# Patient Record
Sex: Male | Born: 1985
Health system: Southern US, Community
[De-identification: ages and names within clinical notes are randomized; demographics above are authoritative.]

## PROBLEM LIST (undated history)

## (undated) DIAGNOSIS — Z720 Tobacco use: Secondary | ICD-10-CM

---

## 2006-08-01 ENCOUNTER — Emergency Department: Payer: Self-pay | Admitting: Emergency Medicine

## 2006-09-21 ENCOUNTER — Ambulatory Visit: Payer: Self-pay | Admitting: Emergency Medicine

## 2008-02-02 ENCOUNTER — Emergency Department: Payer: Self-pay | Admitting: Emergency Medicine

## 2008-03-13 ENCOUNTER — Ambulatory Visit: Payer: Self-pay | Admitting: Psychiatry

## 2008-03-13 ENCOUNTER — Inpatient Hospital Stay (HOSPITAL_COMMUNITY): Admission: RE | Admit: 2008-03-13 | Discharge: 2008-03-19 | Payer: Self-pay | Admitting: Psychiatry

## 2008-03-13 ENCOUNTER — Emergency Department (HOSPITAL_COMMUNITY): Admission: EM | Admit: 2008-03-13 | Discharge: 2008-03-13 | Payer: Self-pay | Admitting: Emergency Medicine
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
## 2010-08-16 NOTE — Discharge Summary (Signed)
NAMEARJEN, DERINGER NO.:  [id number]   MEDICAL RECORD NO.:  [id number]          PATIENT TYPE:  IPS   LOCATION:  0405                          FACILITY:  [facility]   PHYSICIAN:  Anselm Jungling, MD  DATE OF BIRTH:  01-27-1986   DATE OF ADMISSION:  03/13/2008  DATE OF DISCHARGE:  03/19/2008                               DISCHARGE SUMMARY   IDENTIFYING DATA AND REASON FOR ADMISSION:  This was an inpatient  psychiatric admission for Johnathan Strickland, a 25 year old single [place] American  male who lives with his family outside of [place].  He was admitted  due to recent onset of paranoid psychosis.  Please refer to the  admission note for further details pertaining to the symptoms,  circumstances and history that led to his hospitalization.  He was given  an initial Axis I diagnosis of psychosis NOS.   MEDICAL AND LABORATORY:  The patient was in good health without any  active or chronic medical problems.  He was medically cleared in the  emergency department prior to admission, and then followed by the  psychiatric nurse practitioner during his stay.  There were no  significant medical issues.   HOSPITAL COURSE:  The patient was admitted to the adult inpatient  psychiatric service.  He presented as a well-nourished, normally-  developed adult male who was initially disoriented, confused, and  appeared to be responding to internal stimuli.  He was extremely  guarded, and made unusual grimacing expressions.  He only was able to  get brief, disorganized responses.   He was started on a trial of Zyprexa, which was increased stepwise to an  ultimate dose of 20 mg daily.  His family was contacted.  His mother  reported that he had been employed for 2 years at [employer], but  following a breakup with his girlfriend, who also worked in [employer], he began to have paranoid ideation that employees were  talking about him, and because of this, he refused to go back to  work  there.  He had no previous mental health history.  It was not  necessarily felt that he was abusing drugs or alcohol.   The patient began to respond to medication towards the end of his  inpatient stay.  At that time he became more spontaneous, appropriate,  and showed better insight into his situation.  On the final hospital day  there was a family session involving the patient and his mother.  He  reported that he was feeling well, had more energy, and felt more  outgoing.  He was able to discuss his depression and sadness over the  loss of his girlfriend.   The patient appeared to display some excessively religious ideation at  that time, but he appeared to be appropriate for discharge, and his  mother was comfortable taking him home.  There did not appear to be any  safety concerns.  The patient agree __________.   AFTERCARE:  The patient was to follow up at [facility] in  [place], [place], appointment time to be arranged at the time  of this  dictation.   DISCHARGE MEDICATIONS:  Zyprexa/Zydis 20 mg q.h.s.   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  AXIS II:  Deferred.  AXIS III:  No acute or chronic illnesses.  AXIS IV:  Stressors, severe.  AXIS V:  Global assessment of functioning on discharge 55.      Anselm Jungling, MD  Electronically Signed     SPB/MEDQ  D:  03/22/2008  T:  03/22/2008  Job:  [id number]

## 2011-01-03 LAB — CBC
HCT: 46.1 % (ref 39.0–52.0)
Hemoglobin: 15.7 g/dL (ref 13.0–17.0)
MCV: 90.8 fL (ref 78.0–100.0)
Platelets: 185 10*3/uL (ref 150–400)
RDW: 12 % (ref 11.5–15.5)
WBC: 12.5 10*3/uL — ABNORMAL HIGH (ref 4.0–10.5)

## 2011-01-03 LAB — HEPATIC FUNCTION PANEL
Bilirubin, Direct: 0.2 mg/dL (ref 0.0–0.3)
Indirect Bilirubin: 1 mg/dL — ABNORMAL HIGH (ref 0.3–0.9)
Total Bilirubin: 1.2 mg/dL (ref 0.3–1.2)

## 2011-01-03 LAB — BASIC METABOLIC PANEL
BUN: 12 mg/dL (ref 6–23)
Chloride: 101 mEq/L (ref 96–112)
Glucose, Bld: 89 mg/dL (ref 70–99)
Potassium: 3.9 mEq/L (ref 3.5–5.1)

## 2011-01-03 LAB — DIFFERENTIAL
Eosinophils Absolute: 0.1 10*3/uL (ref 0.0–0.7)
Eosinophils Relative: 0 % (ref 0–5)
Lymphs Abs: 2.1 10*3/uL (ref 0.7–4.0)
Monocytes Absolute: 1.1 10*3/uL — ABNORMAL HIGH (ref 0.1–1.0)

## 2011-01-03 LAB — URINALYSIS, ROUTINE W REFLEX MICROSCOPIC
Glucose, UA: NEGATIVE mg/dL
Ketones, ur: 40 mg/dL — AB
Nitrite: NEGATIVE
Protein, ur: NEGATIVE mg/dL
Urobilinogen, UA: 0.2 mg/dL (ref 0.0–1.0)

## 2011-01-03 LAB — RPR: RPR Ser Ql: NONREACTIVE

## 2011-01-03 LAB — RAPID URINE DRUG SCREEN, HOSP PERFORMED
Benzodiazepines: NOT DETECTED
Tetrahydrocannabinol: NOT DETECTED

## 2011-01-03 LAB — T4, FREE: Free T4: 1.13 ng/dL (ref 0.89–1.80)

## 2011-01-03 LAB — TRICYCLICS SCREEN, URINE: TCA Scrn: NOT DETECTED

## 2011-01-03 LAB — TSH: TSH: 0.645 u[IU]/mL (ref 0.350–4.500)

## 2011-01-03 LAB — GC/CHLAMYDIA PROBE AMP, URINE: GC Probe Amp, Urine: NEGATIVE

## 2016-11-15 ENCOUNTER — Encounter (HOSPITAL_COMMUNITY): Payer: Self-pay | Admitting: *Deleted

## 2016-11-15 ENCOUNTER — Emergency Department (HOSPITAL_COMMUNITY)
Admission: EM | Admit: 2016-11-15 | Discharge: 2016-11-15 | Disposition: A | Discharge status: Home / Self Care | Payer: Self-pay | Attending: Emergency Medicine | Admitting: Emergency Medicine

## 2016-11-15 DIAGNOSIS — F17210 Nicotine dependence, cigarettes, uncomplicated: Secondary | ICD-10-CM | POA: Insufficient documentation

## 2016-11-15 DIAGNOSIS — L02412 Cutaneous abscess of left axilla: Secondary | ICD-10-CM | POA: Insufficient documentation

## 2016-11-15 MED ORDER — HYDROCODONE-ACETAMINOPHEN 5-325 MG PO TABS
1.0000 | ORAL_TABLET | Freq: Once | ORAL | Status: AC
  Administered 2016-11-15: 1 via ORAL
  Filled 2016-11-15: qty 1

## 2016-11-15 MED ORDER — POVIDONE-IODINE 10 % EX SOLN
CUTANEOUS | Status: AC
  Filled 2016-11-15: qty 15

## 2016-11-15 MED ORDER — HYDROCODONE-ACETAMINOPHEN 5-325 MG PO TABS: ORAL_TABLET | ORAL | 0 refills | Status: DC

## 2016-11-15 MED ORDER — SULFAMETHOXAZOLE-TRIMETHOPRIM 800-160 MG PO TABS
1.0000 | ORAL_TABLET | Freq: Once | ORAL | Status: AC
  Administered 2016-11-15: 1 via ORAL
  Filled 2016-11-15: qty 1

## 2016-11-15 MED ORDER — SULFAMETHOXAZOLE-TRIMETHOPRIM 800-160 MG PO TABS: 1.0000 | ORAL_TABLET | Freq: Two times a day (BID) | ORAL | 0 refills | Status: AC

## 2016-11-15 MED ORDER — LIDOCAINE-EPINEPHRINE (PF) 2 %-1:200000 IJ SOLN
10.0000 mL | Freq: Once | INTRAMUSCULAR | Status: AC
  Administered 2016-11-15: 10 mL via INTRADERMAL
  Filled 2016-11-15: qty 20

## 2016-11-15 MED ORDER — IBUPROFEN 800 MG PO TABS
800.0000 mg | ORAL_TABLET | Freq: Three times a day (TID) | ORAL | 0 refills | Status: DC
Start: 2016-11-15 — End: 2017-08-11

## 2016-11-15 NOTE — ED Notes (Addendum)
ED Provider at bedside for I&D. Suture set up at bedside.

## 2016-11-15 NOTE — ED Provider Notes (Signed)
[facility]-EMER[facility]Y DEPT Provider Note   CSN: [id number] Arrival date & time: 11/15/16  1814     History   Chief Complaint Chief Complaint  Patient presents with  . Abscess    HPI Johnathan Lauritsen. is a 31 y.o. male.  HPI  Johnathan Albares. is a 31 y.o. male who presents to the Emergency Department complaining of pain, swelling to the left axilla. Symptoms present for one week.  Has hx of previous "boils" to his underarm.  He states the area began to drain several hours prior to arrival.  Pain to his left underarm associated with movement of the arm.  He denies fever, chills, chest pain and redness. No new deodorant.  Nothing makes the symptoms better.     History reviewed. No pertinent past medical history.  There are no active problems to display for this patient.   History reviewed. No pertinent surgical history.   Home Medications    Prior to Admission medications   Not on File    Family History No family history on file.  Social History Social History  Substance Use Topics  . Smoking status: Current Every Day Smoker    Packs/day: 1.00    Types: Cigarettes  . Smokeless tobacco: Never Used  . Alcohol use Yes     Comment: 1-3 beers daily      Allergies   Patient has no known allergies.   Review of Systems Review of Systems  Constitutional: Negative for chills and fever.  Gastrointestinal: Negative for nausea and vomiting.  Musculoskeletal: Negative for arthralgias and joint swelling.  Skin: Negative for color change.       Pain, swelling and of the left axilla  Hematological: Negative for adenopathy.  All other systems reviewed and are negative.    Physical Exam Updated Vital Signs BP 136/75   Pulse 76   Temp 98.4 F (36.9 C) (Oral)   Resp 15   Ht 6' (1.829 m)   Wt 102.1 kg (225 lb)   SpO2 98%   BMI 30.52 kg/m   Physical Exam  Constitutional: He is oriented to person, place, and time. He appears well-developed and  well-nourished. No distress.  HENT:  Head: Normocephalic and atraumatic.  Neck: Normal range of motion. Neck supple.  Cardiovascular: Normal rate, regular rhythm and intact distal pulses.   Pulmonary/Chest: Effort normal and breath sounds normal. No respiratory distress. He exhibits no tenderness.  Musculoskeletal: Normal range of motion.  Neurological: He is alert and oriented to person, place, and time. No sensory deficit. He exhibits normal muscle tone. Coordination normal.  Skin: Skin is warm and dry. Capillary refill takes less than 2 seconds. There is erythema.  Large, focal area of fluctuance and induration of left axilla.  Mild surrounding erythema. No drainage.   Nursing note and vitals reviewed.    ED Treatments / Results  Labs (all labs ordered are listed, but only abnormal results are displayed) Labs Reviewed - No data to display  EKG  EKG Interpretation None       Radiology No results found.  Procedures Procedures (including critical care time)  INCISION AND DRAINAGE Performed by: Maxwell Caul. Consent: Verbal consent obtained. Risks and benefits: risks, benefits and alternatives were discussed Type: abscess  Body area: left axilla  Anesthesia: local infiltration  Incision was made with a # 11scalpel.  Local anesthetic: lidocaine 2 % w/ epinephrine  Anesthetic total: 3 ml  Complexity: complex Blunt dissection to break up loculations  Drainage:  purulent  Drainage amount: small  Packing material: 1/4 in iodoform gauze  Patient tolerance: Patient tolerated the procedure well with no immediate complications.   Medications Ordered in ED Medications  povidone-iodine (BETADINE) 10 % external solution (not administered)  sulfamethoxazole-trimethoprim (BACTRIM DS,SEPTRA DS) 800-160 MG per tablet 1 tablet (not administered)  HYDROcodone-acetaminophen (NORCO/VICODIN) 5-325 MG per tablet 1 tablet (not administered)  lidocaine-EPINEPHrine  (XYLOCAINE W/EPI) 2 %-1:200000 (PF) injection 10 mL (10 mLs Intradermal Given by Other 11/15/16 1944)     Initial Impression / Assessment and Plan / ED Course  I have reviewed the triage vital signs and the nursing notes.  Pertinent labs & imaging results that were available during my care of the patient were reviewed by me and considered in my medical decision making (see chart for details).     Pt well appearing,  Non-toxic.  Abscess to left axilla w/o lymphangitis. Hx of same.  Pt agrees to packing removal in 2 days, warm wet compresses and ER return if not improving  Final Clinical Impressions(s) / ED Diagnoses   Final diagnoses:  Abscess of left axilla    New Prescriptions New Prescriptions   No medications on file     Rosey Bath 11/16/16 2347    Eber Hong, MD 11/17/16 2340

## 2016-11-15 NOTE — Discharge Instructions (Signed)
Warm wet compresses or soaks to the left underarm area 2-3 times a day.  Keep the area bandaged.  The packing will need to be removed in 2 days.  Return here for any worsening symptoms

## 2016-11-15 NOTE — ED Triage Notes (Signed)
Pt c/o "boil" to left axilla x 1 week. Johnathan Strickland pussy drainage per family. Denies fever.

## 2017-08-11 ENCOUNTER — Encounter (HOSPITAL_COMMUNITY): Payer: Self-pay | Admitting: Emergency Medicine

## 2017-08-11 ENCOUNTER — Inpatient Hospital Stay (HOSPITAL_COMMUNITY)
Admission: EM | Admit: 2017-08-11 | Discharge: 2017-08-14 | DRG: 638 | Disposition: A | Discharge status: Home / Self Care | Payer: Self-pay | Attending: Internal Medicine | Admitting: Internal Medicine

## 2017-08-11 DIAGNOSIS — Z720 Tobacco use: Secondary | ICD-10-CM

## 2017-08-11 DIAGNOSIS — Z833 Family history of diabetes mellitus: Secondary | ICD-10-CM

## 2017-08-11 DIAGNOSIS — E785 Hyperlipidemia, unspecified: Secondary | ICD-10-CM | POA: Diagnosis present

## 2017-08-11 DIAGNOSIS — N179 Acute kidney failure, unspecified: Secondary | ICD-10-CM

## 2017-08-11 DIAGNOSIS — D72829 Elevated white blood cell count, unspecified: Secondary | ICD-10-CM | POA: Diagnosis present

## 2017-08-11 DIAGNOSIS — F17210 Nicotine dependence, cigarettes, uncomplicated: Secondary | ICD-10-CM | POA: Diagnosis present

## 2017-08-11 DIAGNOSIS — E1110 Type 2 diabetes mellitus with ketoacidosis without coma: Principal | ICD-10-CM | POA: Diagnosis present

## 2017-08-11 DIAGNOSIS — R739 Hyperglycemia, unspecified: Secondary | ICD-10-CM

## 2017-08-11 DIAGNOSIS — E876 Hypokalemia: Secondary | ICD-10-CM | POA: Diagnosis present

## 2017-08-11 DIAGNOSIS — E1010 Type 1 diabetes mellitus with ketoacidosis without coma: Secondary | ICD-10-CM

## 2017-08-11 DIAGNOSIS — E1310 Other specified diabetes mellitus with ketoacidosis without coma: Secondary | ICD-10-CM

## 2017-08-11 DIAGNOSIS — E119 Type 2 diabetes mellitus without complications: Secondary | ICD-10-CM

## 2017-08-11 DIAGNOSIS — Z888 Allergy status to other drugs, medicaments and biological substances status: Secondary | ICD-10-CM

## 2017-08-11 DIAGNOSIS — E860 Dehydration: Secondary | ICD-10-CM | POA: Diagnosis present

## 2017-08-11 HISTORY — DX: Tobacco use: Z72.0

## 2017-08-11 LAB — BASIC METABOLIC PANEL
Anion gap: 23 — ABNORMAL HIGH (ref 5–15)
BUN: 18 mg/dL (ref 6–20)
CHLORIDE: 91 mmol/L — AB (ref 101–111)
CO2: 19 mmol/L — AB (ref 22–32)
Calcium: 10.3 mg/dL (ref 8.9–10.3)
Creatinine, Ser: 1.83 mg/dL — ABNORMAL HIGH (ref 0.61–1.24)
GFR calc Af Amer: 55 mL/min — ABNORMAL LOW (ref >60)
GFR calc non Af Amer: 47 mL/min — ABNORMAL LOW (ref >60)
GLUCOSE: 725 mg/dL — AB (ref 65–99)
POTASSIUM: 4.4 mmol/L (ref 3.5–5.1)
Sodium: 133 mmol/L — ABNORMAL LOW (ref 135–145)

## 2017-08-11 LAB — CBG MONITORING, ED
GLUCOSE-CAPILLARY: 308 mg/dL — AB (ref 65–99)
Glucose-Capillary: >600 mg/dL (ref 65–99)
Glucose-Capillary: 461 mg/dL — ABNORMAL HIGH (ref 65–99)
Glucose-Capillary: 393 mg/dL — ABNORMAL HIGH (ref 65–99)

## 2017-08-11 LAB — CBC
HEMATOCRIT: 47.8 % (ref 39.0–52.0)
Hemoglobin: 16.9 g/dL (ref 13.0–17.0)
MCH: 28.9 pg (ref 26.0–34.0)
MCHC: 35.4 g/dL (ref 30.0–36.0)
MCV: 81.7 fL (ref 78.0–100.0)
Platelets: 273 10*3/uL (ref 150–400)
RBC: 5.85 MIL/uL — ABNORMAL HIGH (ref 4.22–5.81)
RDW: 12.8 % (ref 11.5–15.5)
WBC: 11.2 10*3/uL — ABNORMAL HIGH (ref 4.0–10.5)

## 2017-08-11 LAB — I-STAT VENOUS BLOOD GAS, ED
Acid-base deficit: 3 mmol/L — ABNORMAL HIGH (ref 0.0–2.0)
Bicarbonate: 22.7 mmol/L (ref 20.0–28.0)
O2 SAT: 43 %
PCO2 VEN: 43.7 mmHg — AB (ref 44.0–60.0)
PO2 VEN: 26 mmHg — AB (ref 32.0–45.0)
TCO2: 24 mmol/L (ref 22–32)
pH, Ven: 7.324 (ref 7.250–7.430)

## 2017-08-11 LAB — URINALYSIS, ROUTINE W REFLEX MICROSCOPIC
BACTERIA UA: NONE SEEN
BILIRUBIN URINE: NEGATIVE
HGB URINE DIPSTICK: NEGATIVE
Ketones, ur: 20 mg/dL — AB
LEUKOCYTES UA: NEGATIVE
NITRITE: NEGATIVE
Protein, ur: NEGATIVE mg/dL
SPECIFIC GRAVITY, URINE: 1.023 (ref 1.005–1.030)
pH: 5 (ref 5.0–8.0)

## 2017-08-11 LAB — BETA-HYDROXYBUTYRIC ACID: Beta-Hydroxybutyric Acid: 6.42 mmol/L — ABNORMAL HIGH (ref 0.05–0.27)

## 2017-08-11 MED ORDER — NICOTINE 21 MG/24HR TD PT24
21.0000 mg | MEDICATED_PATCH | Freq: Every day | TRANSDERMAL | Status: DC
  Filled 2017-08-11 (×2): qty 1

## 2017-08-11 MED ORDER — SODIUM CHLORIDE 0.9 % IV BOLUS
2000.0000 mL | Freq: Once | INTRAVENOUS | Status: AC
  Administered 2017-08-11: 2000 mL via INTRAVENOUS

## 2017-08-11 MED ORDER — ZOLPIDEM TARTRATE 5 MG PO TABS: 5.0000 mg | ORAL_TABLET | Freq: Every evening | ORAL | Status: DC | PRN

## 2017-08-11 MED ORDER — SODIUM CHLORIDE 0.9 % IV SOLN
INTRAVENOUS | Status: DC
  Administered 2017-08-11 – 2017-08-12 (×2): via INTRAVENOUS

## 2017-08-11 MED ORDER — ONDANSETRON HCL 4 MG/2ML IJ SOLN
4.0000 mg | Freq: Once | INTRAMUSCULAR | Status: AC
  Administered 2017-08-11: 4 mg via INTRAVENOUS

## 2017-08-11 MED ORDER — DEXTROSE-NACL 5-0.45 % IV SOLN: INTRAVENOUS | Status: DC

## 2017-08-11 MED ORDER — POTASSIUM CHLORIDE 10 MEQ/100ML IV SOLN
10.0000 meq | INTRAVENOUS | Status: AC
  Administered 2017-08-11 – 2017-08-12 (×2): 10 meq via INTRAVENOUS
  Filled 2017-08-11 (×2): qty 100

## 2017-08-11 MED ORDER — DEXTROSE-NACL 5-0.45 % IV SOLN
INTRAVENOUS | Status: DC
Start: 2017-08-11 — End: 2017-08-12
  Administered 2017-08-12: 02:00:00 via INTRAVENOUS

## 2017-08-11 MED ORDER — ENOXAPARIN SODIUM 40 MG/0.4ML ~~LOC~~ SOLN
40.0000 mg | Freq: Every day | SUBCUTANEOUS | Status: DC
  Administered 2017-08-12 – 2017-08-13 (×2): 40 mg via SUBCUTANEOUS
  Filled 2017-08-11 (×2): qty 0.4

## 2017-08-11 MED ORDER — SODIUM CHLORIDE 0.9 % IV SOLN
INTRAVENOUS | Status: DC
  Administered 2017-08-11: 5.4 [IU]/h via INTRAVENOUS
  Filled 2017-08-11: qty 1

## 2017-08-11 MED ORDER — SODIUM CHLORIDE 0.9 % IV SOLN
INTRAVENOUS | Status: DC
  Filled 2017-08-11: qty 1

## 2017-08-11 MED ORDER — SODIUM CHLORIDE 0.9 % IV BOLUS
1000.0000 mL | Freq: Once | INTRAVENOUS | Status: AC
  Administered 2017-08-11: 1000 mL via INTRAVENOUS

## 2017-08-11 MED ORDER — ACETAMINOPHEN 325 MG PO TABS: 650.0000 mg | ORAL_TABLET | Freq: Four times a day (QID) | ORAL | Status: DC | PRN

## 2017-08-11 MED ORDER — ONDANSETRON HCL 4 MG/2ML IJ SOLN
INTRAMUSCULAR | Status: AC
  Filled 2017-08-11: qty 4

## 2017-08-11 MED ORDER — ONDANSETRON HCL 4 MG/2ML IJ SOLN: 4.0000 mg | Freq: Three times a day (TID) | INTRAMUSCULAR | Status: DC | PRN

## 2017-08-11 MED ORDER — SODIUM CHLORIDE 0.9 % IV SOLN
8.0000 mg | Freq: Once | INTRAVENOUS | Status: DC
  Filled 2017-08-11: qty 4

## 2017-08-11 MED ORDER — SODIUM CHLORIDE 0.9 % IV SOLN
Freq: Once | INTRAVENOUS | Status: AC
  Administered 2017-08-11: 21:00:00 via INTRAVENOUS

## 2017-08-11 NOTE — ED Notes (Addendum)
RN notified of CBG value collected at 20:26.

## 2017-08-11 NOTE — H&P (Addendum)
History and Physical    Johnathan Strickland. DGU:[id number] DOB: 04-Feb-1986 DOA: 08/11/2017  Referring MD/NP/PA:   PCP: Patient, No Pcp Per   Patient coming from:  The patient is coming from home.  At baseline, pt is independent for most of ADL.   Chief Complaint: Generalized weakness, lightheadedness, polyuria, polydipsia  HPI: Johnathan Strickland. is a 32 y.o. male with medical history significant of tobacco abuse, who presents with generalized weakness, lightheadedness, polyuria, polydipsia.  Patient states that he has been having feeling bad in the past 4 days, which has been progressively getting worse.  He has generalized weakness, lightheadedness, polyuria and polydipsia.  He has mild cough, but no chest pain or shortness of breath.  Denies fever or chills.  Patient has nausea, no vomiting, diarrhea or abdominal pain.  Patient denies dysuria or burning on urination. No unilateral weakness or numbness in extremities.  No history of diabetes.  Patient  ED Course: pt was found to have new onset of diabetes with DKA, bicarbonate 19, blood sugar 725, anion gap 23, WBC 11.2, negative urinalysis, temperature 99.5, tachycardia, tachypnea, oxygen saturation 97% on room air.  Patient is admitted to stepdown as inpatient.  Review of Systems:   General: no fevers, chills, no body weight gain, has poor appetite, has fatigue HEENT: no blurry vision, hearing changes or sore throat Respiratory: no dyspnea, has coughing, no wheezing CV: no chest pain, no palpitations GI: no nausea, vomiting, abdominal pain, diarrhea, constipation GU: no dysuria, burning on urination, has increased urinary frequency, no  hematuria  Ext: no leg edema Neuro: no unilateral weakness, numbness, or tingling, no vision change or hearing loss Skin: no rash, no skin tear. MSK: No muscle spasm, no deformity, no limitation of range of movement in spin Heme: No easy bruising.  Travel history: No recent long distant  travel.  Allergy: No Known Allergies  Past Medical History:  Diagnosis Date  . Tobacco abuse     History reviewed. No pertinent surgical history.  Social History:  reports that he has been smoking cigarettes.  He has been smoking about 1.00 pack per day. He has never used smokeless tobacco. He reports that he drinks alcohol. He reports that he does not use drugs.  Family History:  Family History  Problem Relation Age of Onset  . Diabetes Mellitus II Father      Prior to Admission medications   Not on File    Physical Exam: Vitals:   08/11/17 2146 08/11/17 2200 08/11/17 2230 08/11/17 2300  BP: (!) 148/111 (!) 150/112 (!) 139/108 (!) 137/104  Pulse: 94 92 80 85  Resp:      Temp:      TempSrc:      SpO2: 96% 96% 98% 100%   General: Not in acute distress.  Dry mucous membrane HEENT:       Eyes: PERRL, EOMI, no scleral icterus.       ENT: No discharge from the ears and nose, no pharynx injection, no tonsillar enlargement.        Neck: No JVD, no bruit, no mass felt. Heme: No neck lymph node enlargement. Cardiac: S1/S2, RRR, No murmurs, No gallops or rubs. Respiratory: no rales, wheezing, rhonchi or rubs. GI: Soft, nondistended, nontender, no rebound pain, no organomegaly, BS present. GU: No hematuria Ext: No pitting leg edema bilaterally. 2+DP/PT pulse bilaterally. Musculoskeletal: No joint deformities, No joint redness or warmth, no limitation of ROM in spin. Skin: No rashes.  Neuro: Alert, oriented  X3, cranial nerves II-XII grossly intact, moves all extremities normally.  Psych: Patient is not psychotic, no suicidal or hemocidal ideation.  Labs on Admission: I have personally reviewed following labs and imaging studies  CBC: Recent Labs  Lab 08/11/17 1842  WBC 11.2*  HGB 16.9  HCT 47.8  MCV 81.7  PLT 273   Basic Metabolic Panel: Recent Labs  Lab 08/11/17 1842  NA 133*  K 4.4  CL 91*  CO2 19*  GLUCOSE 725*  BUN 18  CREATININE 1.83*  CALCIUM 10.3    GFR: CrCl cannot be calculated (Unknown ideal weight.). Liver Function Tests: No results for input(s): AST, ALT, ALKPHOS, BILITOT, PROT, ALBUMIN in the last 168 hours. No results for input(s): LIPASE, AMYLASE in the last 168 hours. No results for input(s): AMMONIA in the last 168 hours. Coagulation Profile: No results for input(s): INR, PROTIME in the last 168 hours. Cardiac Enzymes: No results for input(s): CKTOTAL, CKMB, CKMBINDEX, TROPONINI in the last 168 hours. BNP (last 3 results) No results for input(s): PROBNP in the last 8760 hours. HbA1C: No results for input(s): HGBA1C in the last 72 hours. CBG: Recent Labs  Lab 08/11/17 1829 08/11/17 2025 08/11/17 2136 08/11/17 2249 08/11/17 2356  GLUCAP >600* >600* 461* 393* 308*   Lipid Profile: No results for input(s): CHOL, HDL, LDLCALC, TRIG, CHOLHDL, LDLDIRECT in the last 72 hours. Thyroid Function Tests: No results for input(s): TSH, T4TOTAL, FREET4, T3FREE, THYROIDAB in the last 72 hours. Anemia Panel: No results for input(s): VITAMINB12, FOLATE, FERRITIN, TIBC, IRON, RETICCTPCT in the last 72 hours. Urine analysis:    Component Value Date/Time   COLORURINE STRAW (A) 08/11/2017 1830   APPEARANCEUR CLEAR 08/11/2017 1830   LABSPEC 1.023 08/11/2017 1830   PHURINE 5.0 08/11/2017 1830   GLUCOSEU >=500 (A) 08/11/2017 1830   HGBUR NEGATIVE 08/11/2017 1830   BILIRUBINUR NEGATIVE 08/11/2017 1830   KETONESUR 20 (A) 08/11/2017 1830   PROTEINUR NEGATIVE 08/11/2017 1830   UROBILINOGEN 0.2 03/13/2008 1936   NITRITE NEGATIVE 08/11/2017 1830   LEUKOCYTESUR NEGATIVE 08/11/2017 1830   Sepsis Labs: (procalcitonin:4,lacticidven:4) )No results found for this or any previous visit (from the past 240 hour(s)).   Radiological Exams on Admission: No results found.   EKG: Independently reviewed. Sinus rhythm, QTC 443, early R wave progression  Assessment/Plan Principal Problem:   DKA (diabetic ketoacidoses)  (HCC) Active Problems:   New onset type 2 diabetes mellitus (HCC)   AKI (acute kidney injury) (HCC)   Tobacco abuse   Leukocytosis   New onset type 2 diabetes mellitus and DKA (diabetic ketoacidoses) (HCC): bicarbonate 19, blood sugar 725, anion gap 23.  Mental status normal.  - will admit to SDU as inpt - IVF: 1L ringer and 2L NS bolus, followed by 125 cc/h - check A1c and FLP - start DKA protocol with BMP q4h - IVF: NS 125 cc/h; will switch to D5-1/2NS when CBG<250 - replete K as needed - Zofran prn nausea  - NPO  - consult to diabetic educator and case manager  Addendum: the BMP at 22:40 showed AG 12, closed -will start Lantus 5 U now -repeat BMP at 2 hours later, if still has normal AG-->will start pt with SSI and d/c insulin drip.   Leukocytosis: Patient has mild leukocytosis WBC 11.3, likely due to stress induced to demargination. UA negative. No fever. -will follow up blood -follow up by CBC   AKI: Likely due to prerenal secondary to dehydration and continuation - IVF as above - Follow  up renal function by BMP  Tobacco abuse: -Did counseling about importance of quitting smoking -Nicotine patch   DVT ppx:  SQ Lovenox Code Status: Full code Family Communication:  Yes, patient's girlfriend at bed side Disposition Plan:  Anticipate discharge back to previous home environment Consults called:  none Admission status:  SDU/inpation       Date of Service 08/12/2017    Lorretta Harp [facility] Pager [phone]  If 7PM-7AM, please contact night-coverage [email or web address] Password [facility] 08/12/2017, 12:31 AM

## 2017-08-11 NOTE — ED Notes (Signed)
Reports feeling light headed as if he is going to pass out.

## 2017-08-11 NOTE — ED Triage Notes (Signed)
Pt to ER sent from [facility] for hyperglycemia. No documented hx of diabetes. Pt reports 3-4 days of "not feeling well," reports frequent urination. CBG monitor at this time is reading "high."

## 2017-08-12 ENCOUNTER — Encounter (HOSPITAL_COMMUNITY): Payer: Self-pay | Admitting: Internal Medicine

## 2017-08-12 ENCOUNTER — Other Ambulatory Visit: Payer: Self-pay

## 2017-08-12 DIAGNOSIS — E1310 Other specified diabetes mellitus with ketoacidosis without coma: Secondary | ICD-10-CM

## 2017-08-12 DIAGNOSIS — E876 Hypokalemia: Secondary | ICD-10-CM

## 2017-08-12 DIAGNOSIS — E7849 Other hyperlipidemia: Secondary | ICD-10-CM

## 2017-08-12 DIAGNOSIS — D72829 Elevated white blood cell count, unspecified: Secondary | ICD-10-CM | POA: Diagnosis present

## 2017-08-12 LAB — GLUCOSE, CAPILLARY
GLUCOSE-CAPILLARY: 205 mg/dL — AB (ref 65–99)
GLUCOSE-CAPILLARY: 193 mg/dL — AB (ref 65–99)
GLUCOSE-CAPILLARY: 168 mg/dL — AB (ref 65–99)
GLUCOSE-CAPILLARY: 156 mg/dL — AB (ref 65–99)
GLUCOSE-CAPILLARY: 293 mg/dL — AB (ref 65–99)
GLUCOSE-CAPILLARY: 336 mg/dL — AB (ref 65–99)
Glucose-Capillary: 225 mg/dL — ABNORMAL HIGH (ref 65–99)
Glucose-Capillary: 284 mg/dL — ABNORMAL HIGH (ref 65–99)

## 2017-08-12 LAB — BASIC METABOLIC PANEL
ANION GAP: 11 (ref 5–15)
ANION GAP: 11 (ref 5–15)
ANION GAP: 11 (ref 5–15)
ANION GAP: 8 (ref 5–15)
Anion gap: 12 (ref 5–15)
BUN: 14 mg/dL (ref 6–20)
BUN: 12 mg/dL (ref 6–20)
BUN: 10 mg/dL (ref 6–20)
BUN: 11 mg/dL (ref 6–20)
BUN: 10 mg/dL (ref 6–20)
CALCIUM: 8.7 mg/dL — AB (ref 8.9–10.3)
CALCIUM: 8.6 mg/dL — AB (ref 8.9–10.3)
CHLORIDE: 104 mmol/L (ref 101–111)
CHLORIDE: 104 mmol/L (ref 101–111)
CHLORIDE: 104 mmol/L (ref 101–111)
CO2: 24 mmol/L (ref 22–32)
CO2: 24 mmol/L (ref 22–32)
CO2: 23 mmol/L (ref 22–32)
CO2: 22 mmol/L (ref 22–32)
CO2: 24 mmol/L (ref 22–32)
Calcium: 8.7 mg/dL — ABNORMAL LOW (ref 8.9–10.3)
Calcium: 8.3 mg/dL — ABNORMAL LOW (ref 8.9–10.3)
Calcium: 8.5 mg/dL — ABNORMAL LOW (ref 8.9–10.3)
Chloride: 103 mmol/L (ref 101–111)
Chloride: 104 mmol/L (ref 101–111)
Creatinine, Ser: 1.39 mg/dL — ABNORMAL HIGH (ref 0.61–1.24)
Creatinine, Ser: 1.49 mg/dL — ABNORMAL HIGH (ref 0.61–1.24)
Creatinine, Ser: 1.41 mg/dL — ABNORMAL HIGH (ref 0.61–1.24)
Creatinine, Ser: 1.28 mg/dL — ABNORMAL HIGH (ref 0.61–1.24)
Creatinine, Ser: 1.24 mg/dL (ref 0.61–1.24)
GFR calc Af Amer: >60 mL/min (ref >60)
GFR calc non Af Amer: >60 mL/min (ref >60)
GLUCOSE: 286 mg/dL — AB (ref 65–99)
GLUCOSE: 152 mg/dL — AB (ref 65–99)
Glucose, Bld: 223 mg/dL — ABNORMAL HIGH (ref 65–99)
Glucose, Bld: 322 mg/dL — ABNORMAL HIGH (ref 65–99)
Glucose, Bld: 270 mg/dL — ABNORMAL HIGH (ref 65–99)
POTASSIUM: 3.5 mmol/L (ref 3.5–5.1)
POTASSIUM: 3.1 mmol/L — AB (ref 3.5–5.1)
POTASSIUM: 3.7 mmol/L (ref 3.5–5.1)
POTASSIUM: 3.9 mmol/L (ref 3.5–5.1)
Potassium: 3.8 mmol/L (ref 3.5–5.1)
SODIUM: 139 mmol/L (ref 135–145)
SODIUM: 138 mmol/L (ref 135–145)
SODIUM: 137 mmol/L (ref 135–145)
SODIUM: 136 mmol/L (ref 135–145)
Sodium: 139 mmol/L (ref 135–145)

## 2017-08-12 LAB — LIPID PANEL
CHOL/HDL RATIO: 6.6 ratio
CHOLESTEROL: 192 mg/dL (ref 0–200)
HDL: 29 mg/dL — AB (ref >40)
LDL Cholesterol: 118 mg/dL — ABNORMAL HIGH (ref 0–99)
TRIGLYCERIDES: 226 mg/dL — AB (ref <150)
VLDL: 45 mg/dL — ABNORMAL HIGH (ref 0–40)

## 2017-08-12 LAB — CBG MONITORING, ED: GLUCOSE-CAPILLARY: 241 mg/dL — AB (ref 65–99)

## 2017-08-12 LAB — SODIUM, URINE, RANDOM: Sodium, Ur: <10 mmol/L

## 2017-08-12 LAB — CREATININE, URINE, RANDOM: Creatinine, Urine: 42.86 mg/dL

## 2017-08-12 LAB — HEMOGLOBIN A1C
Hgb A1c MFr Bld: 11.6 % — ABNORMAL HIGH (ref 4.8–5.6)
Mean Plasma Glucose: 286.22 mg/dL

## 2017-08-12 LAB — HIV ANTIBODY (ROUTINE TESTING W REFLEX): HIV Screen 4th Generation wRfx: NONREACTIVE

## 2017-08-12 LAB — MRSA PCR SCREENING: MRSA BY PCR: NEGATIVE

## 2017-08-12 MED ORDER — INSULIN ASPART 100 UNIT/ML ~~LOC~~ SOLN
0.0000 [IU] | SUBCUTANEOUS | Status: DC
  Administered 2017-08-12: 11 [IU] via SUBCUTANEOUS
  Administered 2017-08-13: 8 [IU] via SUBCUTANEOUS
  Administered 2017-08-13: 5 [IU] via SUBCUTANEOUS
  Administered 2017-08-13: 8 [IU] via SUBCUTANEOUS
  Administered 2017-08-13: 5 [IU] via SUBCUTANEOUS
  Administered 2017-08-13: 8 [IU] via SUBCUTANEOUS
  Administered 2017-08-13: 11 [IU] via SUBCUTANEOUS
  Administered 2017-08-14 (×2): 2 [IU] via SUBCUTANEOUS

## 2017-08-12 MED ORDER — INSULIN GLARGINE 100 UNIT/ML ~~LOC~~ SOLN
5.0000 [IU] | Freq: Every day | SUBCUTANEOUS | Status: DC
  Administered 2017-08-12: 5 [IU] via SUBCUTANEOUS
  Filled 2017-08-12: qty 0.05

## 2017-08-12 MED ORDER — SODIUM CHLORIDE 0.9 % IV SOLN
Freq: Once | INTRAVENOUS | Status: AC
  Administered 2017-08-12: 20:00:00 via INTRAVENOUS

## 2017-08-12 MED ORDER — INSULIN GLARGINE 100 UNIT/ML ~~LOC~~ SOLN
5.0000 [IU] | Freq: Every day | SUBCUTANEOUS | Status: DC
  Filled 2017-08-12: qty 0.05

## 2017-08-12 MED ORDER — INSULIN ASPART 100 UNIT/ML ~~LOC~~ SOLN
3.0000 [IU] | Freq: Once | SUBCUTANEOUS | Status: AC
  Administered 2017-08-12: 3 [IU] via SUBCUTANEOUS

## 2017-08-12 MED ORDER — POTASSIUM CHLORIDE 10 MEQ/100ML IV SOLN
10.0000 meq | INTRAVENOUS | Status: AC
  Administered 2017-08-12 (×4): 10 meq via INTRAVENOUS
  Filled 2017-08-12 (×4): qty 100

## 2017-08-12 MED ORDER — INSULIN ASPART 100 UNIT/ML ~~LOC~~ SOLN
0.0000 [IU] | Freq: Three times a day (TID) | SUBCUTANEOUS | Status: DC
  Administered 2017-08-12: 5 [IU] via SUBCUTANEOUS
  Administered 2017-08-12: 3 [IU] via SUBCUTANEOUS
  Administered 2017-08-12: 2 [IU] via SUBCUTANEOUS

## 2017-08-12 MED ORDER — INSULIN ASPART PROT & ASPART (70-30 MIX) 100 UNIT/ML ~~LOC~~ SUSP
10.0000 [IU] | Freq: Two times a day (BID) | SUBCUTANEOUS | Status: DC
  Filled 2017-08-12: qty 10

## 2017-08-12 MED ORDER — ATORVASTATIN CALCIUM 20 MG PO TABS
20.0000 mg | ORAL_TABLET | Freq: Every day | ORAL | Status: DC
  Administered 2017-08-12 – 2017-08-13 (×2): 20 mg via ORAL
  Filled 2017-08-12 (×2): qty 1

## 2017-08-12 MED ORDER — LIVING WELL WITH DIABETES BOOK
Freq: Once | Status: AC
  Administered 2017-08-12: 10:00:00
  Filled 2017-08-12: qty 1

## 2017-08-12 NOTE — Progress Notes (Signed)
Received report from [facility] in the ED.

## 2017-08-12 NOTE — Progress Notes (Addendum)
Patient transferred from ED to [phone]. Patient A&Ox4. Stepdown monitor [id number] applied and second verified. Skin assessment completed by 2 RNs. Patient instructed how to use the callbell before getting out of bed. Callbell within reach. Will continue to monitor and treat per MD orders.

## 2017-08-12 NOTE — Progress Notes (Signed)
PROGRESS NOTE    Johnathan Strickland.  YQI:[id number] DOB: 11-29-1985 DOA: 08/11/2017 PCP: Patient, No Pcp Per   Brief Narrative:  32 y.o. BM PMHx tobacco abuse,   Presents with generalized weakness, lightheadedness, polyuria, polydipsia.   Patient states that he has been having feeling bad in the past 4 days, which has been progressively getting worse.  He has generalized weakness, lightheadedness, polyuria and polydipsia.  He has mild cough, but no chest pain or shortness of breath.  Denies fever or chills.  Patient has nausea, no vomiting, diarrhea or abdominal pain.  Patient denies dysuria or burning on urination. No unilateral weakness or numbness in extremities.  No history of diabetes.  Patient   ED Course: pt was found to have new onset of diabetes with DKA, bicarbonate 19, blood sugar 725, anion gap 23, WBC 11.2, negative urinalysis, temperature 99.5, tachycardia, tachypnea, oxygen saturation 97% on room air.  Patient is admitted to stepdown as inpatient.    Subjective: 5/15/O x4, negative CP, negative abdominal pain, negative S OB.  Has family history of diabetes, however this is his first episode of diabetes/DKA.    Assessment & Plan:   Principal Problem:   DKA (diabetic ketoacidoses) (HCC) Active Problems:   New onset type 2 diabetes mellitus (HCC)   AKI (acute kidney injury) (HCC)   Tobacco abuse   Leukocytosis  Diabetes type 2 New Onset/DKA -5/15 Hemoglobin A1c= 11.6 -Patient requires PCP - Patient requires diabetic nutritionist consult -Seen by diabetic coordinator: Recommendations left in chart.  Medication is going to be an issue given patient has no insurance - DC Lantus - NovoLog 70/30 10 units BID per diabetic coordinator recommendation -Moderate SSI  HLD - Lipid panel not with an ADA guidelines - Start Lipitor 20 mg daily  Leukocytosis -Most likely stress demargination, negative fever, negative overt signs of infection - Monitor closely.    Acute kidney injury -Most likely secondary to dehydration/DKA -Continue normal saline 100 ml/hr     Tobacco abuse: -Did counseling about importance of quitting smoking -Nicotine patch   Hypokalemia -Potassium 40 mEq     DVT prophylaxis: Lovenox Code Status: Full Family Communication: Sister at bedside for discussion of plan of care Disposition Plan: Home on 5/16 if stable   Consultants:  None  Procedures/Significant Events:  None   I have personally reviewed and interpreted all radiology studies and my findings are as above.  VENTILATOR SETTINGS:    Cultures   Antimicrobials:    Devices    LINES / TUBES:      Continuous Infusions: . sodium chloride 125 mL/hr at 08/11/17 2317     Objective: Vitals:   08/12/17 0426 08/12/17 0430 08/12/17 0501 08/12/17 0752  BP: (!) 131/97     Pulse: 63     Resp: 15     Temp:  98.1 F (36.7 C) 98.2 F (36.8 C) 97.6 F (36.4 C)  TempSrc:  Oral Oral Oral  SpO2: 98%     Weight:      Height:        Intake/Output Summary (Last 24 hours) at 08/12/2017 0854 Last data filed at 08/12/2017 3875 Gross per 24 hour  Intake 4389.11 ml  Output -  Net 4389.11 ml   Filed Weights   08/12/17 0214  Weight: 248 lb (112.5 kg)    Examination:  General: A/O x4 No acute respiratory distress Lungs: Clear to auscultation bilaterally without wheezes or crackles Cardiovascular: Regular rate and rhythm without murmur gallop or rub  normal S1 and S2 Abdomen: negative abdominal pain, nondistended, positive soft, bowel sounds, no rebound, no ascites, no appreciable mass Extremities: No significant cyanosis, clubbing, or edema bilateral lower extremities Skin: Negative rashes, lesions, ulcers Psychiatric:  Negative depression, negative anxiety, negative fatigue, negative mania  Central nervous system:  Cranial nerves II through XII intact, tongue/uvula midline, all extremities muscle strength 5/5, sensation intact throughout,   negative dysarthria, negative expressive aphasia, negative receptive aphasia.  .     Data Reviewed: Care during the described time interval was provided by me .  I have reviewed this patient's available data, including medical history, events of note, physical examination, and all test results as part of my evaluation.   CBC: Recent Labs  Lab 08/11/17 1842  WBC 11.2*  HGB 16.9  HCT 47.8  MCV 81.7  PLT 273   Basic Metabolic Panel: Recent Labs  Lab 08/11/17 1842 08/11/17 2240 08/12/17 0240 08/12/17 0633  NA 133* 139 139 138  K 4.4 3.8 3.5 3.1*  CL 91* 103 104 104  CO2 19* GLUCOSE 725* 286* 223* 152*  BUN CREATININE 1.83* 1.39* 1.49* 1.41*  CALCIUM 10.3 8.7* 8.7* 8.6*   GFR: Estimated Creatinine Clearance: 97.4 mL/min (A) (by C-G formula based on SCr of 1.41 mg/dL (H)). Liver Function Tests: No results for input(s): AST, ALT, ALKPHOS, BILITOT, PROT, ALBUMIN in the last 168 hours. No results for input(s): LIPASE, AMYLASE in the last 168 hours. No results for input(s): AMMONIA in the last 168 hours. Coagulation Profile: No results for input(s): INR, PROTIME in the last 168 hours. Cardiac Enzymes: No results for input(s): CKTOTAL, CKMB, CKMBINDEX, TROPONINI in the last 168 hours. BNP (last 3 results) No results for input(s): PROBNP in the last 8760 hours. HbA1C: Recent Labs    08/12/17 0633  HGBA1C 11.6*   CBG: Recent Labs  Lab 08/12/17 0108 08/12/17 0216 08/12/17 0321 08/12/17 0425 08/12/17 0749  GLUCAP 241* 205* 193* 168* 156*   Lipid Profile: Recent Labs    08/12/17 0633  CHOL 192  HDL 29*  LDLCALC 118*  TRIG 226*  CHOLHDL 6.6   Thyroid Function Tests: No results for input(s): TSH, T4TOTAL, FREET4, T3FREE, THYROIDAB in the last 72 hours. Anemia Panel: No results for input(s): VITAMINB12, FOLATE, FERRITIN, TIBC, IRON, RETICCTPCT in the last 72 hours. Urine analysis:    Component Value Date/Time   COLORURINE STRAW (A)  08/11/2017 1830   APPEARANCEUR CLEAR 08/11/2017 1830   LABSPEC 1.023 08/11/2017 1830   PHURINE 5.0 08/11/2017 1830   GLUCOSEU >=500 (A) 08/11/2017 1830   HGBUR NEGATIVE 08/11/2017 1830   BILIRUBINUR NEGATIVE 08/11/2017 1830   KETONESUR 20 (A) 08/11/2017 1830   PROTEINUR NEGATIVE 08/11/2017 1830   UROBILINOGEN 0.2 03/13/2008 1936   NITRITE NEGATIVE 08/11/2017 1830   LEUKOCYTESUR NEGATIVE 08/11/2017 1830   Sepsis Labs: (procalcitonin:4,lacticidven:4)  ) Recent Results (from the past 240 hour(s))  MRSA PCR Screening     Status: None   Collection Time: 08/12/17  2:22 AM  Result Value Ref Range Status   MRSA by PCR NEGATIVE NEGATIVE Final    Comment:        The GeneXpert MRSA Assay (FDA approved for NASAL specimens only), is one component of a comprehensive MRSA colonization surveillance program. It is not intended to diagnose MRSA infection nor to guide or monitor treatment for MRSA infections. Performed at [facility], [address] [place], [place] [address]  Radiology Studies: No results found.      Scheduled Meds: . enoxaparin (LOVENOX) injection  40 mg Subcutaneous Daily  . insulin aspart  0-9 Units Subcutaneous TID WC  . insulin glargine  5 Units Subcutaneous Daily  . living well with diabetes book   Does not apply Once  . nicotine  21 mg Transdermal Daily   Continuous Infusions: . sodium chloride 125 mL/hr at 08/11/17 2317     LOS: 1 day    Time spent: 40 minutes    Linder Prajapati, Roselind Messier, MD [facility] Pager [phone]   If 7PM-7AM, please contact night-coverage [email or web address] Password [facility] 08/12/2017, 8:54 AM

## 2017-08-12 NOTE — ED Notes (Signed)
Spoke w/ main lab about adding on lab work to urine sent down earlier. She sts she would see if there's enough left over.

## 2017-08-12 NOTE — Progress Notes (Addendum)
Inpatient Diabetes Program Recommendations  AACE/ADA: New Consensus Statement on Inpatient Glycemic Control (2015)  Target Ranges:  Prepandial:   less than 140 mg/dL      Peak postprandial:   less than 180 mg/dL (1-2 hours)      Critically ill patients:  140 - 180 mg/dL  Results for Johnathan Strickland, Johnathan Strickland (MRN [id number]) as of 08/12/2017 08:55  Ref. Range 08/12/2017 01:08 08/12/2017 02:16 08/12/2017 03:21 08/12/2017 04:25 08/12/2017 07:49  Glucose-Capillary Latest Ref Range: 65 - 99 mg/dL 329 (H) 518 (H)  Lantsu 5 units@ 2:18 193 (H) 168 (H)  Novolog 3 units 156 (H)   Results for Johnathan Strickland, Johnathan Strickland (MRN [id number]) as of 08/12/2017 08:55  Ref. Range 08/11/2017 18:29 08/11/2017 20:25 08/11/2017 21:36 08/11/2017 22:49 08/11/2017 23:56  Glucose-Capillary Latest Ref Range: 65 - 99 mg/dL >160 (HH) >109 (HH) 323 (H) 393 (H) 308 (H)  Results for Johnathan Strickland, Johnathan Strickland (MRN [id number]) as of 08/12/2017 08:55  Ref. Range 08/11/2017 18:42  Glucose Latest Ref Range: 65 - 99 mg/dL [facility])  Results for Johnathan Strickland, Johnathan Strickland (MRN [id number]) as of 08/12/2017 08:55  Ref. Range 08/12/2017 06:33  Hemoglobin A1C Latest Ref Range: 4.8 - 5.6 % 11.6 (H)   Review of Glycemic Control  Diabetes history: No Outpatient Diabetes medications: NA Current orders for Inpatient glycemic control: Lantus 5 units daily, Novolog 0-9 units TID with meals  Inpatient Diabetes Program Recommendations: Insulin-Basal: Please consider discontinuing Lantus and ordering 70/30 10 units BID (would provide a total of 14 units for basal and 6 units for meal coverage per day). HgbA1C: A1C 11.6% on 08/12/17 indicating an average glucose of 286 mg/dl over the past 2-3 months.   NOTE: Noted consult for new onset DM. Ordered Living Well with DM book, RD consult for diet education, patient education by bedside RNs, and already has order for CM consult. Patient has no insurance or PCP listed in chart so will need assistance with follow up and  maybe with medications. Patient was on IV insulin and received Lantus 5 units at 2:18 am today. Will plan to see patient today.  Addendum 08/12/17@13 :40-Spoke with patient about new diabetes diagnosis.  Patient reports that his father has diabetes and takes insulin. Patient confirms that he does not have any insurance nor a PCP. Discussed A1C results (11.6% on 08/12/17) and explained what an A1C is and informed patient that his current A1C indicates an average glucose of 286 mg/dl over the past 2-3 months. Patient has Living Well with DM book at bedside and he has already been reading through the book.  Discussed basic pathophysiology of DM Type 2, basic home care, importance of checking CBGs and maintaining good CBG control to prevent long-term and short-term complications. Reviewed glucose and A1C goals. Reviewed signs and symptoms of hyperglycemia and hypoglycemia along with treatment for both. Discussed impact of nutrition, exercise, stress, sickness, and medications on diabetes control. Reviewed Living Well with diabetes booklet and encouraged patient to finish reading through entire book. Discussed insulin as currently ordered and explained that if patient will be discharged on insulin, it would be recommended to use NOVOLIN (70/30, NPH, Regular) insulin since it is more affordable. Informed patient that Novolin insulin (NPH, Regular, 70/30) can be purchased at [facility] for $25 per vial. Patient reports that he would be able to afford $25 for vial of insulin. Provided patient with handout information on Reli-On products and encouraged patient to go to [facility] to get the Reli-On Prime glucometer  for $9 and a box of 50 Reli-On test strips for $9. Explained that it would likely be recommended to use 70/30 insulin as an outpatient if MD plans to discharge him on insulin. Discussed 70/30 in detail (how to take it, when to take it). Asked patient to check his glucose as MD directs and to keep a log book of glucose  readings and insulin taken. Explained how the doctor he follows up with can use the log book to continue to make insulin adjustments if needed. Discussed importance of consistent follow up; discussed various community resources [facility], [facility], and [facility]) and informed patient that CM should be working with him to arrange follow up.  Reviewed and demonstrated how to draw up and administer insulin with vial and syringe. Patient was able to successfully demonstrate how to draw up and administer insulin with vial and syringe. Informed patient that RN will continue to ask him to self-administer insulin to ensure proper technique and ability to administer self insulin shots.  Patient verbalized understanding of information discussed and he states that he has no further questions at this time related to diabetes.   RNs to provide ongoing basic DM education at bedside with this patient and engage patient to actively check blood glucose and administer insulin injections in case he is discharged new to insulin.   Thanks, Orlando Penner, RN, MSN, CDE Diabetes Coordinator Inpatient Diabetes Program [phone] (Team Pager from 8am to 5pm)

## 2017-08-13 LAB — MAGNESIUM: Magnesium: 1.8 mg/dL (ref 1.7–2.4)

## 2017-08-13 LAB — BASIC METABOLIC PANEL
ANION GAP: 8 (ref 5–15)
BUN: 6 mg/dL (ref 6–20)
CHLORIDE: 105 mmol/L (ref 101–111)
CO2: 26 mmol/L (ref 22–32)
Calcium: 8.7 mg/dL — ABNORMAL LOW (ref 8.9–10.3)
Creatinine, Ser: 1.1 mg/dL (ref 0.61–1.24)
GFR calc Af Amer: >60 mL/min (ref >60)
GFR calc non Af Amer: >60 mL/min (ref >60)
GLUCOSE: 211 mg/dL — AB (ref 65–99)
POTASSIUM: 3.6 mmol/L (ref 3.5–5.1)
Sodium: 139 mmol/L (ref 135–145)

## 2017-08-13 LAB — GLUCOSE, CAPILLARY
GLUCOSE-CAPILLARY: 213 mg/dL — AB (ref 65–99)
GLUCOSE-CAPILLARY: 241 mg/dL — AB (ref 65–99)
GLUCOSE-CAPILLARY: 291 mg/dL — AB (ref 65–99)
Glucose-Capillary: 174 mg/dL — ABNORMAL HIGH (ref 65–99)
Glucose-Capillary: 296 mg/dL — ABNORMAL HIGH (ref 65–99)
Glucose-Capillary: 314 mg/dL — ABNORMAL HIGH (ref 65–99)

## 2017-08-13 LAB — CBC
HCT: 36.8 % — ABNORMAL LOW (ref 39.0–52.0)
Hemoglobin: 12.8 g/dL — ABNORMAL LOW (ref 13.0–17.0)
MCH: 29.5 pg (ref 26.0–34.0)
MCHC: 34.8 g/dL (ref 30.0–36.0)
MCV: 84.8 fL (ref 78.0–100.0)
PLATELETS: 175 10*3/uL (ref 150–400)
RBC: 4.34 MIL/uL (ref 4.22–5.81)
RDW: 12.7 % (ref 11.5–15.5)
WBC: 7.9 10*3/uL (ref 4.0–10.5)

## 2017-08-13 MED ORDER — INSULIN ASPART PROT & ASPART (70-30 MIX) 100 UNIT/ML ~~LOC~~ SUSP
18.0000 [IU] | Freq: Two times a day (BID) | SUBCUTANEOUS | Status: DC
  Administered 2017-08-13 – 2017-08-14 (×2): 18 [IU] via SUBCUTANEOUS
  Filled 2017-08-13: qty 10

## 2017-08-13 MED ORDER — INSULIN ASPART 100 UNIT/ML ~~LOC~~ SOLN
8.0000 [IU] | Freq: Three times a day (TID) | SUBCUTANEOUS | Status: DC
  Administered 2017-08-13: 8 [IU] via SUBCUTANEOUS

## 2017-08-13 NOTE — Plan of Care (Signed)
  RD consulted for nutrition education regarding diabetes.   Lab Results  Component Value Date   HGBA1C 11.6 (H) 08/12/2017   Johnathan Stamps. is a 32 y.o. male with medical history significant of tobacco abuse, who presents with generalized weakness, lightheadedness, polyuria, polydipsia  RD provided "Carbohydrate Counting for People with Diabetes" handout from the Academy of Nutrition and Dietetics. Discussed different food groups and their effects on blood sugar, emphasizing carbohydrate-containing foods. Provided list of carbohydrates and recommended serving sizes of common foods.  Discussed importance of controlled and consistent carbohydrate intake throughout the day. Provided examples of ways to balance meals/snacks and encouraged intake of high-fiber, whole grain complex carbohydrates. Teach back method used.  Expect fair compliance.  Body mass index is 33.63 kg/m. Pt meets criteria for obese class I based on current BMI.  Current diet order is carb modified, patient is consuming approximately 100% of meals at this time. Labs and medications reviewed. No further nutrition interventions warranted at this time. RD contact information provided. If additional nutrition issues arise, please re-consult RD.  Dionne Ano. Providencia Hottenstein, MS, RD LDN Inpatient Clinical Dietitian Pager [phone]

## 2017-08-13 NOTE — Progress Notes (Signed)
Inpatient Diabetes Program Recommendations  AACE/ADA: New Consensus Statement on Inpatient Glycemic Control (2015)  Target Ranges:  Prepandial:   less than 140 mg/dL      Peak postprandial:   less than 180 mg/dL (1-2 hours)      Critically ill patients:  140 - 180 mg/dL   Results for DELLIS, VOGHT (MRN [id number]) as of 08/13/2017 09:22  Ref. Range 08/12/2017 07:49 08/12/2017 12:12 08/12/2017 17:13 08/12/2017 20:20 08/12/2017 23:47 08/13/2017 04:12 08/13/2017 07:43 08/13/2017 09:02  Glucose-Capillary Latest Ref Range: 65 - 99 mg/dL 409 (H) 811 (H) 914 (H) 336 (H) 284 (H) 213 (H) 174 (H) 241 (H)    Review of Glycemic Control Diabetes history: No Outpatient Diabetes medications: NA Current orders for Inpatient glycemic control: 70/30 18 units BID, Novolog 0-15 units Q4H  Inpatient Diabetes Program Recommendations:  Insulin - Basal: Noted 70/30 10 units BID was ordered on 08/12/17 to start on 08/13/17. Patient did not receive any 70/30 on 08/12/17 but did receive Lantus 5 units at 5:15 am on 08/12/17 at time of transition from IV insulin. Noted 70/30 was increased to 18 units BID this morning. Current 70/30 18 units BID will provide a total of 25 units for basal and 11 units for meal coverage per day.   HgbA1C: A1C 11.6% on 08/12/17 indicating an average glucose of 286 mg/dl over the past 2-3 months. Patient will need affordable insulin for outpatient use. Recommend NOVOLIN 70/30 which can be purchased at [employer] for $25 per vial.  Thanks, Orlando Penner, RN, MSN, CDE Diabetes Coordinator Inpatient Diabetes Program [phone] (Team Pager from 8am to 5pm)

## 2017-08-13 NOTE — Progress Notes (Signed)
PROGRESS NOTE    Johnathan Strickland.  ZOX:[id number] DOB: [birth year] DOA: 08/11/2017 PCP: Patient, No Pcp Per   Brief Narrative:  32 y.o. BM PMHx tobacco abuse,   Presents with generalized weakness, lightheadedness, polyuria, polydipsia.   Patient states that he has been having feeling bad in the past 4 days, which has been progressively getting worse.  He has generalized weakness, lightheadedness, polyuria and polydipsia.  He has mild cough, but no chest pain or shortness of breath.  Denies fever or chills.  Patient has nausea, no vomiting, diarrhea or abdominal pain.  Patient denies dysuria or burning on urination. No unilateral weakness or numbness in extremities.  No history of diabetes.  Patient   ED Course: pt was found to have new onset of diabetes with DKA, bicarbonate 19, blood sugar 725, anion gap 23, WBC 11.2, negative urinalysis, temperature 99.5, tachycardia, tachypnea, oxygen saturation 97% on room air.  Patient is admitted to stepdown as inpatient.    Subjective: 5/16/O x4, negative CP, negative abdominal pain, negative S OB.  States on his tray were cups which he ate though he was not sure that he should (most likely because of continued elevated CBG).     Assessment & Plan:   Principal Problem:   DKA (diabetic ketoacidoses) (HCC) Active Problems:   New onset type 2 diabetes mellitus (HCC)   AKI (acute kidney injury) (HCC)   Tobacco abuse   Leukocytosis  Diabetes type 2 New Onset/DKA -5/15 Hemoglobin A1c= 11.6 -Patient requires PCP - Patient requires diabetic nutritionist consult -Seen by diabetic coordinator: Recommendations left in chart.  Medication is going to be an issue given patient has no insurance - 5/16 increase NovoLog 70/30 18 units BID  -5/16 start NovoLog 8 units QAC -Moderate SSI  HLD - Lipid panel not with an ADA guidelines - Start Lipitor 20 mg daily  Leukocytosis -Most likely stress demargination, negative fever, negative overt  signs of infection - Monitor closely.   Acute kidney injury -Most likely secondary to dehydration/DKA -Decrease normal saline 50 ml/hr. Cr continues to improve     Tobacco abuse: -Did counseling about importance of quitting smoking -Nicotine patch   Hypokalemia -Stable     DVT prophylaxis: Lovenox Code Status: Full Family Communication: Sister at bedside for discussion of plan of care Disposition Plan: Home on 5/16 if stable   Consultants:  None  Procedures/Significant Events:  None   I have personally reviewed and interpreted all radiology studies and my findings are as above.  VENTILATOR SETTINGS:    Cultures   Antimicrobials:    Devices    LINES / TUBES:      Continuous Infusions:    Objective: Vitals:   08/12/17 1809 08/12/17 1900 08/12/17 2355 08/13/17 0413  BP: 124/84     Pulse: 81     Resp: 19     Temp:  98.2 F (36.8 C) 97.9 F (36.6 C) 98.3 F (36.8 C)  TempSrc:   Oral Oral  SpO2: 96%     Weight:      Height:        Intake/Output Summary (Last 24 hours) at 08/13/2017 0841 Last data filed at 08/13/2017 0400 Gross per 24 hour  Intake 2690.83 ml  Output -  Net 2690.83 ml   Filed Weights   08/12/17 0214  Weight: 248 lb (112.5 kg)   Physical Exam:  General: A/O x4, No acute respiratory distress Neck:  Negative scars, masses, torticollis, lymphadenopathy, JVD Lungs: Clear to auscultation bilaterally  without wheezes or crackles Cardiovascular: Regular rate and rhythm without murmur gallop or rub normal S1 and S2 Abdomen: negative abdominal pain, nondistended, positive soft, bowel sounds, no rebound, no ascites, no appreciable mass Extremities: No significant cyanosis, clubbing, or edema bilateral lower extremities Skin: Negative rashes, lesions, ulcers Psychiatric:  Negative depression, negative anxiety, negative fatigue, negative mania  Central nervous system:  Cranial nerves II through XII intact, tongue/uvula midline, all  extremities muscle strength 5/5, sensation intact throughout, negative dysarthria, negative expressive aphasia, negative receptive aphasia..     Data Reviewed: Care during the described time interval was provided by me .  I have reviewed this patient's available data, including medical history, events of note, physical examination, and all test results as part of my evaluation.   CBC: Recent Labs  Lab 08/11/17 1842 08/13/17 0438  WBC 11.2* 7.9  HGB 16.9 12.8*  HCT 47.8 36.8*  MCV 81.7 84.8  PLT 273 175   Basic Metabolic Panel: Recent Labs  Lab 08/12/17 0240 08/12/17 0633 08/12/17 1034 08/12/17 1408 08/13/17 0438  NA 139 138 137 136 139  K 3.5 3.1* 3.7 3.9 3.6  CL 104 104 104 104 105  CO2 GLUCOSE 223* 152* 322* 270* 211*  BUN CREATININE 1.49* 1.41* 1.28* 1.24 1.10  CALCIUM 8.7* 8.6* 8.3* 8.5* 8.7*  MG  --   --   --   --  1.8   GFR: Estimated Creatinine Clearance: 124.9 mL/min (by C-G formula based on SCr of 1.1 mg/dL). Liver Function Tests: No results for input(s): AST, ALT, ALKPHOS, BILITOT, PROT, ALBUMIN in the last 168 hours. No results for input(s): LIPASE, AMYLASE in the last 168 hours. No results for input(s): AMMONIA in the last 168 hours. Coagulation Profile: No results for input(s): INR, PROTIME in the last 168 hours. Cardiac Enzymes: No results for input(s): CKTOTAL, CKMB, CKMBINDEX, TROPONINI in the last 168 hours. BNP (last 3 results) No results for input(s): PROBNP in the last 8760 hours. HbA1C: Recent Labs    08/12/17 0633  HGBA1C 11.6*   CBG: Recent Labs  Lab 08/12/17 1212 08/12/17 1713 08/12/17 2020 08/12/17 2347 08/13/17 0412  GLUCAP 293* 225* 336* 284* 213*   Lipid Profile: Recent Labs    08/12/17 0633  CHOL 192  HDL 29*  LDLCALC 118*  TRIG 226*  CHOLHDL 6.6   Thyroid Function Tests: No results for input(s): TSH, T4TOTAL, FREET4, T3FREE, THYROIDAB in the last 72 hours. Anemia Panel: No results  for input(s): VITAMINB12, FOLATE, FERRITIN, TIBC, IRON, RETICCTPCT in the last 72 hours. Urine analysis:    Component Value Date/Time   COLORURINE STRAW (A) 08/11/2017 1830   APPEARANCEUR CLEAR 08/11/2017 1830   LABSPEC 1.023 08/11/2017 1830   PHURINE 5.0 08/11/2017 1830   GLUCOSEU >=500 (A) 08/11/2017 1830   HGBUR NEGATIVE 08/11/2017 1830   BILIRUBINUR NEGATIVE 08/11/2017 1830   KETONESUR 20 (A) 08/11/2017 1830   PROTEINUR NEGATIVE 08/11/2017 1830   UROBILINOGEN 0.2 03/13/2008 1936   NITRITE NEGATIVE 08/11/2017 1830   LEUKOCYTESUR NEGATIVE 08/11/2017 1830   Sepsis Labs: (procalcitonin:4,lacticidven:4)  ) Recent Results (from the past 240 hour(s))  MRSA PCR Screening     Status: None   Collection Time: 08/12/17  2:22 AM  Result Value Ref Range Status   MRSA by PCR NEGATIVE NEGATIVE Final    Comment:        The GeneXpert MRSA Assay (FDA approved for NASAL specimens only), is one  component of a comprehensive MRSA colonization surveillance program. It is not intended to diagnose MRSA infection nor to guide or monitor treatment for MRSA infections. Performed at [facility], [address], [place], [place] [address]          Radiology Studies: No results found.      Scheduled Meds: . atorvastatin  20 mg Oral q1800  . enoxaparin (LOVENOX) injection  40 mg Subcutaneous Daily  . insulin aspart  0-15 Units Subcutaneous Q4H  . insulin aspart protamine- aspart  18 Units Subcutaneous BID WC  . nicotine  21 mg Transdermal Daily   Continuous Infusions:    LOS: 2 days    Time spent: 40 minutes    Donnetta Gillin, Roselind Messier, MD [facility] Pager [phone]   If 7PM-7AM, please contact night-coverage [email or web address] Password [facility] 08/13/2017, 8:41 AM

## 2017-08-14 DIAGNOSIS — R739 Hyperglycemia, unspecified: Secondary | ICD-10-CM

## 2017-08-14 LAB — BASIC METABOLIC PANEL
Anion gap: 11 (ref 5–15)
BUN: 7 mg/dL (ref 6–20)
CO2: 26 mmol/L (ref 22–32)
CREATININE: 1.26 mg/dL — AB (ref 0.61–1.24)
Calcium: 9 mg/dL (ref 8.9–10.3)
Chloride: 102 mmol/L (ref 101–111)
GFR calc Af Amer: >60 mL/min (ref >60)
GFR calc non Af Amer: >60 mL/min (ref >60)
GLUCOSE: 149 mg/dL — AB (ref 65–99)
Potassium: 3.3 mmol/L — ABNORMAL LOW (ref 3.5–5.1)
SODIUM: 139 mmol/L (ref 135–145)

## 2017-08-14 LAB — CBC
HEMATOCRIT: 37.9 % — AB (ref 39.0–52.0)
Hemoglobin: 12.9 g/dL — ABNORMAL LOW (ref 13.0–17.0)
MCH: 28.7 pg (ref 26.0–34.0)
MCHC: 34 g/dL (ref 30.0–36.0)
MCV: 84.2 fL (ref 78.0–100.0)
Platelets: 193 10*3/uL (ref 150–400)
RBC: 4.5 MIL/uL (ref 4.22–5.81)
RDW: 12.6 % (ref 11.5–15.5)
WBC: 7.5 10*3/uL (ref 4.0–10.5)

## 2017-08-14 LAB — GLUCOSE, CAPILLARY
GLUCOSE-CAPILLARY: 150 mg/dL — AB (ref 65–99)
Glucose-Capillary: 125 mg/dL — ABNORMAL HIGH (ref 65–99)
Glucose-Capillary: 174 mg/dL — ABNORMAL HIGH (ref 65–99)
Glucose-Capillary: 218 mg/dL — ABNORMAL HIGH (ref 65–99)
Glucose-Capillary: 74 mg/dL (ref 65–99)

## 2017-08-14 LAB — MAGNESIUM: Magnesium: 1.8 mg/dL (ref 1.7–2.4)

## 2017-08-14 MED ORDER — INSULIN ASPART PROT & ASPART (70-30 MIX) 100 UNIT/ML ~~LOC~~ SUSP: 18.0000 [IU] | Freq: Two times a day (BID) | SUBCUTANEOUS | 2 refills | Status: DC

## 2017-08-14 MED ORDER — BLOOD GLUCOSE MONITOR KIT: PACK | 0 refills | Status: AC

## 2017-08-14 NOTE — Care Management Note (Signed)
Case Management Note  Patient Details  Name: Johnathan Strickland. MRN: [id number] Date of Birth: 07-25-1985  Subjective/Objective:         DKA          Action/Plan: Transition to home today. Post hospital follow scheduled @ [facility], refer to [facility]. Pt states can afford Rx meds. Pt with transportation to home.  Expected Discharge Date:  08/14/17               Expected Discharge Plan:  Home/Self Care  In-House Referral:     Discharge planning Services  CM Consult  Post Acute Care Choice:    Choice offered to:     DME Arranged:   N/A DME Agency:    N/A HH Arranged:   N/A HH Agency:   N/A  Status of Service:  Completed, signed off  If discussed at Long Length of Stay Meetings, dates discussed:    Additional Comments:  Epifanio Lesches, RN 08/14/2017, 4:39 PM

## 2017-08-17 ENCOUNTER — Encounter: Payer: Self-pay | Admitting: Family Medicine

## 2017-08-17 ENCOUNTER — Ambulatory Visit: Payer: Self-pay | Attending: Family Medicine | Admitting: Family Medicine

## 2017-08-17 VITALS — BP 111/78 | HR 92 | Temp 97.3°F | Ht 72.0 in | Wt 259.0 lb

## 2017-08-17 DIAGNOSIS — Z794 Long term (current) use of insulin: Secondary | ICD-10-CM | POA: Insufficient documentation

## 2017-08-17 DIAGNOSIS — Z888 Allergy status to other drugs, medicaments and biological substances status: Secondary | ICD-10-CM | POA: Insufficient documentation

## 2017-08-17 DIAGNOSIS — E119 Type 2 diabetes mellitus without complications: Secondary | ICD-10-CM | POA: Insufficient documentation

## 2017-08-17 DIAGNOSIS — T50905A Adverse effect of unspecified drugs, medicaments and biological substances, initial encounter: Secondary | ICD-10-CM | POA: Insufficient documentation

## 2017-08-17 DIAGNOSIS — T7840XA Allergy, unspecified, initial encounter: Secondary | ICD-10-CM

## 2017-08-17 DIAGNOSIS — Z79899 Other long term (current) drug therapy: Secondary | ICD-10-CM | POA: Insufficient documentation

## 2017-08-17 DIAGNOSIS — Z7952 Long term (current) use of systemic steroids: Secondary | ICD-10-CM | POA: Insufficient documentation

## 2017-08-17 DIAGNOSIS — Z09 Encounter for follow-up examination after completed treatment for conditions other than malignant neoplasm: Secondary | ICD-10-CM | POA: Insufficient documentation

## 2017-08-17 LAB — CULTURE, BLOOD (ROUTINE X 2)
CULTURE: NO GROWTH
Culture: NO GROWTH
SPECIAL REQUESTS: ADEQUATE
Special Requests: ADEQUATE

## 2017-08-17 LAB — GLUCOSE, POCT (MANUAL RESULT ENTRY): POC GLUCOSE: 253 mg/dL — AB (ref 70–99)

## 2017-08-17 MED ORDER — INSULIN GLARGINE 100 UNIT/ML SOLOSTAR PEN: 20.0000 [IU] | PEN_INJECTOR | Freq: Every day | SUBCUTANEOUS | 3 refills | Status: DC

## 2017-08-17 MED ORDER — PREDNISONE 5 MG PO TABS
5.0000 mg | ORAL_TABLET | Freq: Every day | ORAL | 0 refills | Status: DC
Start: 2017-08-17 — End: 2018-11-22

## 2017-08-17 MED FILL — predniSONE 5 MG TABS: 5 | 5 days supply | Qty: 5 | Fill #0

## 2017-08-17 MED FILL — !LANTUS SOLOSTAR 100UNITS/M: 100 | 30 days supply | Qty: 6 | Fill #0

## 2017-08-17 NOTE — Patient Instructions (Signed)
Type 2 Diabetes Mellitus, Diagnosis, Adult Type 2 diabetes (type 2 diabetes mellitus) is a long-term (chronic) disease. It may be caused by one or both of these problems:  Your body does not make enough of a hormone called insulin.  Your body does not react in a normal way to insulin that it makes.  Insulin lets sugars (glucose) go into cells in the body. This gives you energy. If you have type 2 diabetes, sugars cannot get into cells. This causes high blood sugar (hyperglycemia). Your doctor will set treatment goals for you. Generally, you should have these blood sugar levels:  Before meals (preprandial): 80-130 mg/dL (4.4-7.2 mmol/L).  After meals (postprandial): below 180 mg/dL (10 mmol/L).  A1c (hemoglobin A1c) level: less than 7%.  Follow these instructions at home: Questions to Ask Your Doctor  You may want to ask these questions:  Do I need to meet with a diabetes educator?  Where can I find a support group for people with diabetes?  What equipment will I need to care for myself at home?  What diabetes medicines do I need? When should I take them?  How often do I need to check my blood sugar?  What number can I call if I have questions?  When is my next doctor's visit?  General instructions  Take over-the-counter and prescription medicines only as told by your doctor.  Keep all follow-up visits as told by your doctor. This is important. Contact a doctor if:  Your blood sugar is at or above 240 mg/dL (13.3 mmol/L) for 2 days in a row.  You have been sick or have had a fever for 2 days or more and you are not getting better.  You have any of these problems for more than 6 hours: ? You cannot eat or drink. ? You feel sick to your stomach (nauseous). ? You throw up (vomit). ? You have watery poop (diarrhea). Get help right away if:  Your blood sugar is lower than 54 mg/dL (3 mmol/L).  You get confused.  You have trouble: ? Thinking  clearly. ? Breathing.  You have moderate or large ketone levels in your pee (urine). This information is not intended to replace advice given to you by your health care provider. Make sure you discuss any questions you have with your health care provider. Document Released: 12/25/2007 Document Revised: 08/23/2015 Document Reviewed: 04/20/2015 Elsevier Interactive Patient Education  2018 Elsevier Inc[employer]   

## 2017-08-17 NOTE — ED Provider Notes (Signed)
[facility] Provider Note   CSN: [id number] Arrival date & time: 08/11/17  9169     History   Chief Complaint Chief Complaint  Patient presents with  . Hyperglycemia    HPI Johnathan Strickland. is a 32 y.o. male.  Chief complaint is high blood sugar, weakness, urinary frequency.  HPI 32 year old male.  No history of hyper glycemia or diabetes.  He has been urinating frequently for the last week.  Thirsty.  Weak and dizzy.  Seen at urgent care center today and had a blood sugar reading over 600 and was referred here for new diabetes.  Not syncopal or lightheaded.  Dry mouth.  No chest pain or shortness of breath.  No abdominal pain.  Urinary frequency but no dysuria.  No extremity pain or swelling.  Past Medical History:  Diagnosis Date  . Tobacco abuse     Patient Active Problem List   Diagnosis Date Noted  . Hyperglycemia   . Leukocytosis 08/12/2017  . New onset type 2 diabetes mellitus ([place]) 08/11/2017  . DKA (diabetic ketoacidoses) ([place]) 08/11/2017  . AKI (acute kidney injury) ([place]) 08/11/2017  . Tobacco abuse 08/11/2017    History reviewed. No pertinent surgical history.      Home Medications    Prior to Admission medications   Medication Sig Start Date End Date Taking? Authorizing Provider  blood glucose meter kit and supplies KIT Dispense based on patient and insurance preference. Use up to four times daily as directed. (FOR ICD-9 250.00, 250.01). 08/14/17   Domenic Polite, MD  Insulin Glargine (LANTUS SOLOSTAR) 100 UNIT/ML Solostar Pen Inject 20 Units into the skin daily at 10 pm. 08/17/17   Charlott Rakes, MD  predniSONE (DELTASONE) 5 MG tablet Take 1 tablet (5 mg total) by mouth daily with breakfast. 08/17/17   Charlott Rakes, MD    Family History Family History  Problem Relation Age of Onset  . Diabetes Mellitus II Father     Social History Social History   Tobacco Use  . Smoking status: Current Every Day Smoker   Packs/day: 1.00    Types: Cigarettes  . Smokeless tobacco: Never Used  Substance Use Topics  . Alcohol use: Yes    Comment: 1-3 beers daily   . Drug use: No     Allergies   Novolin n [insulin nph (human) (isophane)]   Review of Systems Review of Systems  Constitutional: Negative for appetite change, chills, diaphoresis, fatigue and fever.  HENT: Negative for mouth sores, sore throat and trouble swallowing.   Eyes: Positive for visual disturbance.  Respiratory: Negative for cough, chest tightness, shortness of breath and wheezing.   Cardiovascular: Negative for chest pain.  Gastrointestinal: Negative for abdominal distention, abdominal pain, diarrhea, nausea and vomiting.  Endocrine: Positive for polydipsia, polyphagia and polyuria.  Genitourinary: Negative for dysuria, frequency and hematuria.  Musculoskeletal: Negative for gait problem.  Skin: Negative for color change, pallor and rash.  Neurological: Positive for dizziness and weakness. Negative for syncope, light-headedness and headaches.  Hematological: Does not bruise/bleed easily.  Psychiatric/Behavioral: Negative for behavioral problems and confusion.     Physical Exam Updated Vital Signs BP 119/84 (BP Location: Left Arm)   Pulse 71   Temp 98.7 F (37.1 C) (Oral)   Resp 18   Ht 6' (1.829 m)   Wt 112.5 kg (248 lb)   SpO2 99%   BMI 33.63 kg/m   Physical Exam  Constitutional: He is oriented to person, place, and time.  He appears well-developed and well-nourished. No distress.  HENT:  Head: Normocephalic.  Eyes: Pupils are equal, round, and reactive to light. Conjunctivae are normal. No scleral icterus.  Neck: Normal range of motion. Neck supple. No thyromegaly present.  Cardiovascular: Normal rate and regular rhythm. Exam reveals no gallop and no friction rub.  No murmur heard. Pulmonary/Chest: Effort normal and breath sounds normal. No respiratory distress. He has no wheezes. He has no rales.  Abdominal:  Soft. Bowel sounds are normal. He exhibits no distension. There is no tenderness. There is no rebound.  Musculoskeletal: Normal range of motion.  Neurological: He is alert and oriented to person, place, and time.  Skin: Skin is warm and dry. No rash noted.  Psychiatric: He has a normal mood and affect. His behavior is normal.     ED Treatments / Results  Labs (all labs ordered are listed, but only abnormal results are displayed) Labs Reviewed  BASIC METABOLIC PANEL - Abnormal; Notable for the following components:      Result Value   Sodium 133 (*)    Chloride 91 (*)    CO2 19 (*)    Glucose, Bld 725 (*)    Creatinine, Ser 1.83 (*)    GFR calc non Af Amer 47 (*)    GFR calc Af Amer 55 (*)    Anion gap 23 (*)    All other components within normal limits  CBC - Abnormal; Notable for the following components:   WBC 11.2 (*)    RBC 5.85 (*)    All other components within normal limits  URINALYSIS, ROUTINE W REFLEX MICROSCOPIC - Abnormal; Notable for the following components:   Color, Urine STRAW (*)    Glucose, UA >=500 (*)    Ketones, ur 20 (*)    All other components within normal limits  BETA-HYDROXYBUTYRIC ACID - Abnormal; Notable for the following components:   Beta-Hydroxybutyric Acid 6.42 (*)    All other components within normal limits  HEMOGLOBIN A1C - Abnormal; Notable for the following components:   Hgb A1c MFr Bld 11.6 (*)    All other components within normal limits  LIPID PANEL - Abnormal; Notable for the following components:   Triglycerides 226 (*)    HDL 29 (*)    VLDL 45 (*)    LDL Cholesterol 118 (*)    All other components within normal limits  BASIC METABOLIC PANEL - Abnormal; Notable for the following components:   Glucose, Bld 286 (*)    Creatinine, Ser 1.39 (*)    Calcium 8.7 (*)    All other components within normal limits  BASIC METABOLIC PANEL - Abnormal; Notable for the following components:   Glucose, Bld 223 (*)    Creatinine, Ser 1.49 (*)     Calcium 8.7 (*)    All other components within normal limits  BASIC METABOLIC PANEL - Abnormal; Notable for the following components:   Glucose, Bld 322 (*)    Creatinine, Ser 1.28 (*)    Calcium 8.3 (*)    All other components within normal limits  BASIC METABOLIC PANEL - Abnormal; Notable for the following components:   Glucose, Bld 270 (*)    Calcium 8.5 (*)    All other components within normal limits  BASIC METABOLIC PANEL - Abnormal; Notable for the following components:   Potassium 3.1 (*)    Glucose, Bld 152 (*)    Creatinine, Ser 1.41 (*)    Calcium 8.6 (*)    All  other components within normal limits  GLUCOSE, CAPILLARY - Abnormal; Notable for the following components:   Glucose-Capillary 205 (*)    All other components within normal limits  GLUCOSE, CAPILLARY - Abnormal; Notable for the following components:   Glucose-Capillary 193 (*)    All other components within normal limits  GLUCOSE, CAPILLARY - Abnormal; Notable for the following components:   Glucose-Capillary 168 (*)    All other components within normal limits  GLUCOSE, CAPILLARY - Abnormal; Notable for the following components:   Glucose-Capillary 156 (*)    All other components within normal limits  GLUCOSE, CAPILLARY - Abnormal; Notable for the following components:   Glucose-Capillary 293 (*)    All other components within normal limits  BASIC METABOLIC PANEL - Abnormal; Notable for the following components:   Glucose, Bld 211 (*)    Calcium 8.7 (*)    All other components within normal limits  GLUCOSE, CAPILLARY - Abnormal; Notable for the following components:   Glucose-Capillary 225 (*)    All other components within normal limits  CBC - Abnormal; Notable for the following components:   Hemoglobin 12.8 (*)    HCT 36.8 (*)    All other components within normal limits  GLUCOSE, CAPILLARY - Abnormal; Notable for the following components:   Glucose-Capillary 336 (*)    All other components  within normal limits  GLUCOSE, CAPILLARY - Abnormal; Notable for the following components:   Glucose-Capillary 284 (*)    All other components within normal limits  GLUCOSE, CAPILLARY - Abnormal; Notable for the following components:   Glucose-Capillary 213 (*)    All other components within normal limits  GLUCOSE, CAPILLARY - Abnormal; Notable for the following components:   Glucose-Capillary 174 (*)    All other components within normal limits  GLUCOSE, CAPILLARY - Abnormal; Notable for the following components:   Glucose-Capillary 241 (*)    All other components within normal limits  GLUCOSE, CAPILLARY - Abnormal; Notable for the following components:   Glucose-Capillary 291 (*)    All other components within normal limits  BASIC METABOLIC PANEL - Abnormal; Notable for the following components:   Potassium 3.3 (*)    Glucose, Bld 149 (*)    Creatinine, Ser 1.26 (*)    All other components within normal limits  CBC - Abnormal; Notable for the following components:   Hemoglobin 12.9 (*)    HCT 37.9 (*)    All other components within normal limits  GLUCOSE, CAPILLARY - Abnormal; Notable for the following components:   Glucose-Capillary 296 (*)    All other components within normal limits  GLUCOSE, CAPILLARY - Abnormal; Notable for the following components:   Glucose-Capillary 314 (*)    All other components within normal limits  GLUCOSE, CAPILLARY - Abnormal; Notable for the following components:   Glucose-Capillary 125 (*)    All other components within normal limits  GLUCOSE, CAPILLARY - Abnormal; Notable for the following components:   Glucose-Capillary 150 (*)    All other components within normal limits  GLUCOSE, CAPILLARY - Abnormal; Notable for the following components:   Glucose-Capillary 218 (*)    All other components within normal limits  GLUCOSE, CAPILLARY - Abnormal; Notable for the following components:   Glucose-Capillary 174 (*)    All other components within  normal limits  CBG MONITORING, ED - Abnormal; Notable for the following components:   Glucose-Capillary >600 (*)    All other components within normal limits  CBG MONITORING, ED - Abnormal; Notable   for the following components:   Glucose-Capillary >600 (*)    All other components within normal limits  I-STAT VENOUS BLOOD GAS, ED - Abnormal; Notable for the following components:   pCO2, Ven 43.7 (*)    pO2, Ven 26.0 (*)    Acid-base deficit 3.0 (*)    All other components within normal limits  CBG MONITORING, ED - Abnormal; Notable for the following components:   Glucose-Capillary 461 (*)    All other components within normal limits  CBG MONITORING, ED - Abnormal; Notable for the following components:   Glucose-Capillary 393 (*)    All other components within normal limits  CBG MONITORING, ED - Abnormal; Notable for the following components:   Glucose-Capillary 308 (*)    All other components within normal limits  CBG MONITORING, ED - Abnormal; Notable for the following components:   Glucose-Capillary 241 (*)    All other components within normal limits  CULTURE, BLOOD (ROUTINE X 2)  CULTURE, BLOOD (ROUTINE X 2)  MRSA PCR SCREENING  CREATININE, URINE, RANDOM  SODIUM, URINE, RANDOM  HIV ANTIBODY (ROUTINE TESTING)  MAGNESIUM  MAGNESIUM  GLUCOSE, CAPILLARY    EKG EKG Interpretation  Date/Time:  Tuesday Aug 11 2017 18:32:18 EDT Ventricular Rate:  110 PR Interval:  136 QRS Duration: 82 QT Interval:  328 QTC Calculation: 443 R Axis:   28 Text Interpretation:  Sinus tachycardia Otherwise normal ECG Confirmed by Virgel Manifold [phone]) on 08/12/2017 10:48:40 AM   Radiology No results found.  Procedures Procedures (including critical care time)  Medications Ordered in ED Medications  sodium chloride 0.9 % bolus 1,000 mL (0 mLs Intravenous Stopped 08/11/17 2137)  0.9 %  sodium chloride infusion ( Intravenous Stopped 08/11/17 2317)  ondansetron (ZOFRAN) injection 4 mg (4 mg  Intravenous Given 08/11/17 2146)  ondansetron (ZOFRAN) injection 4 mg (4 mg Intravenous Given 08/11/17 2146)  sodium chloride 0.9 % bolus 2,000 mL (0 mLs Intravenous Stopped 08/12/17 0026)  potassium chloride 10 mEq in 100 mL IVPB (0 mEq Intravenous Stopped 08/12/17 0146)  insulin aspart (novoLOG) injection 3 Units (3 Units Subcutaneous Given 08/12/17 0451)  living well with diabetes book MISC ( Does not apply Given 08/12/17 0935)  potassium chloride 10 mEq in 100 mL IVPB (0 mEq Intravenous Stopped 08/12/17 1358)  0.9 %  sodium chloride infusion ( Intravenous Stopped 08/13/17 0600)     Initial Impression / Assessment and Plan / ED Course  I have reviewed the triage vital signs and the nursing notes.  Pertinent labs & imaging results that were available during my care of the patient were reviewed by me and considered in my medical decision making (see chart for details).     Patient with CBG greater than 600.  Has acidosis and elevated anion gap.  He does not appear toxic.  He is mentating well.  Normal mental status.  Normal neurological exam.  Given IV fluids.  Started on glucose stabilizer/insulin infusion.  I discussed the case with hospitalist.  Patient will be admitted for ongoing care and treatment of his new onset diabetes with DKA.  CRITICAL CARE Performed by: Lolita Patella   Total critical care time: 30 minutes  Critical care time was exclusive of separately billable procedures and treating other patients.  Critical care was necessary to treat or prevent imminent or life-threatening deterioration.  Critical care was time spent personally by me on the following activities: development of treatment plan with patient and/or surrogate as well as nursing, discussions with consultants,  evaluation of patient's response to treatment, examination of patient, obtaining history from patient or surrogate, ordering and performing treatments and interventions, ordering and review of laboratory  studies, ordering and review of radiographic studies, pulse oximetry and re-evaluation of patient's condition.   Final Clinical Impressions(s) / ED Diagnoses   Final diagnoses:  Hyperglycemia    ED Discharge Orders        Ordered    insulin aspart protamine- aspart (NOVOLOG MIX 70/30) (70-30) 100 UNIT/ML injection  2 times daily with meals,   Status:  Discontinued     08/14/17 0952    blood glucose meter kit and supplies KIT     08/14/17 0952    Increase activity slowly     08/14/17 1609    Diet Carb Modified     08/14/17 1609       James, Mark, MD 08/17/17 1444  

## 2017-08-17 NOTE — Progress Notes (Signed)
Patient has hives on his arms.

## 2017-08-17 NOTE — Progress Notes (Signed)
Subjective:  Patient ID: Johnathan Chancy., male    DOB: [birth year]  Age: 32 y.o. MRN: [id number]  CC: Diabetes; Establish Care; and Hospitalization Follow-up   HPI Johnathan Seymore. is a 32 year old male  with newly diagnosed type 2 diabetes mellitus (A1c 11.6) diagnosed during his most recent hospitalization at [facility] from 08/11/2017 through 08/13/2017 after he had presented in DKA. He had presented with a blood sugar of 725, anion gap of 23, bicarb of 19 and was started on IV fluids and insulin drip and admitted to stepdown.  He underwent diabetic education and was subsequently transitioned to NovoLog 70/30 which he was discharged on.  He presents today accompanied by a male companion and informs me his fasting blood sugars have been in the 80s however this morning his fasting blood sugar was 186.   Two days ago he developed generalized pruritus, hives and supraorbital edema commenced yesterday.  He took some Benadryl with slight improvement in symptoms.  Denies shortness of breath, dysphagia.  He has no known allergies to foods, creams and only endorses eating a hamburger at [employer] over the weekend.  Past Medical History:  Diagnosis Date  . Tobacco abuse     History reviewed. No pertinent surgical history.  Allergies  Allergen Reactions  . Novolin N [Insulin Nph (Human) (Isophane)] Hives     Outpatient Medications Prior to Visit  Medication Sig Dispense Refill  . blood glucose meter kit and supplies KIT Dispense based on patient and insurance preference. Use up to four times daily as directed. (FOR ICD-9 250.00, 250.01). 1 each 0  . insulin aspart protamine- aspart (NOVOLOG MIX 70/30) (70-30) 100 UNIT/ML injection Inject 0.18 mLs (18 Units total) into the skin 2 (two) times daily with a meal. 10 mL 2   No facility-administered medications prior to visit.     ROS Review of Systems  Constitutional: Negative for activity change and appetite change.    HENT: Negative for sinus pressure and sore throat.   Eyes: Negative for visual disturbance.  Respiratory: Negative for cough, chest tightness and shortness of breath.   Cardiovascular: Negative for chest pain and leg swelling.  Gastrointestinal: Negative for abdominal distention, abdominal pain, constipation and diarrhea.  Endocrine: Negative.   Genitourinary: Negative for dysuria.  Musculoskeletal: Negative for joint swelling and myalgias.  Skin: Positive for rash.  Allergic/Immunologic: Negative.   Neurological: Negative for weakness, light-headedness and numbness.  Psychiatric/Behavioral: Negative for dysphoric mood and suicidal ideas.    Objective:  BP 111/78   Pulse 92   Temp (!) 97.3 F (36.3 C) (Oral)   Ht 6' (1.829 m)   Wt 259 lb (117.5 kg)   SpO2 98%   BMI 35.13 kg/m   BP/Weight 08/17/2017 08/14/2017 06/24/7122  Systolic BP 580 998 -  Diastolic BP 78 84 -  Wt. (Lbs) 259 - 248  BMI 35.13 - 33.63      Physical Exam  Constitutional: He is oriented to person, place, and time. He appears well-developed and well-nourished.  Eyes:  Edema of both upper eyelid with some hyperemia  Cardiovascular: Normal rate, normal heart sounds and intact distal pulses.  No murmur heard. Pulmonary/Chest: Effort normal and breath sounds normal. He has no wheezes. He has no rales. He exhibits no tenderness.  Abdominal: Soft. Bowel sounds are normal. He exhibits no distension and no mass. There is no tenderness.  Musculoskeletal: Normal range of motion.  Neurological: He is alert and oriented to person, place, and  time.  Skin:  Erythematous rash on posterior and anterior trunk  Psychiatric: He has a normal mood and affect.     CMP Latest Ref Rng & Units 08/14/2017 08/13/2017 08/12/2017  Glucose 65 - 99 mg/dL 149(H) 211(H) 270(H)  BUN 6 - 20 mg/dL _0 Creatinine 0.61 - 1.24 mg/dL 1.26(H) 1.10 1.24  Sodium 135 - 145 mmol/L 139 139 136  Potassium 3.5 - 5.1 mmol/L 3.3(L) 3.6 3.9   Chloride 101 - 111 mmol/L 102 105 104  CO2 22 - 32 mmol/L _1 Calcium 8.9 - 10.3 mg/dL 9.0 8.7(L) 8.5(L)  Total Protein 6.0 - 8.3 g/dL - - -  Total Bilirubin 0.3 - 1.2 mg/dL - - -  Alkaline Phos 39 - 117 U/L - - -  AST 0 - 37 U/L - - -  ALT 0 - 53 U/L - - -    Lipid Panel     Component Value Date/Time   CHOL 192 08/12/2017 0633   TRIG 226 (H) 08/12/2017 0633   HDL 29 (L) 08/12/2017 0633   CHOLHDL 6.6 08/12/2017 0633   VLDL 45 (H) 08/12/2017 0633   LDLCALC 118 (H) 08/12/2017 0633    Lab Results  Component Value Date   HGBA1C 11.6 (H) 08/12/2017     Assessment & Plan:   1. New onset type 2 diabetes mellitus ([place]) Newly diagnosed with A1c of 11.6 Blood sugars have been controlled however he may be developing an allergic reaction to NovoLog 70/30 which have discontinued Comments Lantus and educated on uptitrating Lantus by 2 units every fourth day until blood sugars are at goal; anticipate some hyperglycemia due to prednisone initiation. We will review blood sugar log at next visit and discuss diabetic healthcare maintenance and initiation of statin. Counseled on Diabetic diet, my plate method, 629 minutes of moderate intensity exercise/week Keep blood sugar logs with fasting goals of 80-120 mg/dl, random of less than 180 and in the event of sugars less than 60 mg/dl or greater than 400 mg/dl please notify the clinic ASAP. It is recommended that you undergo annual eye exams and annual foot exams. Pneumonia vaccine is recommended. - POCT glucose (manual entry) - Insulin Glargine (LANTUS SOLOSTAR) 100 UNIT/ML Solostar Pen; Inject 20 Units into the skin daily at 10 pm.  Dispense: 5 pen; Refill: 3  2. Allergic reaction to drug, initial encounter Likely allergic drug reaction to NovoLog 70/30 - predniSONE (DELTASONE) 5 MG tablet; Take 1 tablet (5 mg total) by mouth daily with breakfast.  Dispense: 5 tablet; Refill: 0   Meds ordered this encounter  Medications  . Insulin  Glargine (LANTUS SOLOSTAR) 100 UNIT/ML Solostar Pen    Sig: Inject 20 Units into the skin daily at 10 pm.    Dispense:  5 pen    Refill:  3    Discontinue NovoLog 70/30  . predniSONE (DELTASONE) 5 MG tablet    Sig: Take 1 tablet (5 mg total) by mouth daily with breakfast.    Dispense:  5 tablet    Refill:  0    Follow-up: Return in about 1 month (around 09/17/2017) for Follow-up of diabetes mellitus.   Charlott Rakes MD

## 2017-08-25 NOTE — Discharge Summary (Signed)
Physician Discharge Summary  Johnathan Strickland. NOB:[id number] DOB: 1985-12-26 DOA: 08/11/2017  PCP: Patient, No Pcp Per  Admit date: 08/11/2017 Discharge date: 08/14/2017  Time spent: 35 minutes  Recommendations for Outpatient Follow-up:  1. PCP in 1 week Dr.Newlin   Discharge Diagnoses:  Principal Problem:   DKA (diabetic ketoacidoses) ([place]) Active Problems:   New onset type 2 diabetes mellitus ([place])   AKI (acute kidney injury) ([place])   Tobacco abuse   Leukocytosis   Hyperglycemia   Discharge Condition: improved  Diet recommendation: carb modified  Filed Weights   08/12/17 0214  Weight: 112.5 kg (248 lb)    History of present illness:  32 y.o.BM PMHx tobacco abuse, presented with generalized weakness, lightheadedness, polyuria, polydipsia. In ED:pt was found to havenew onset of diabetes with DKA, bicarbonate 19, blood sugar 725, anion gap 23  Hospital Course:   Diabetes type 2 New Onset/DKA -5/15 Hemoglobin A1c= 11.6 -treated with Insulin gtt, IVF and then transitioned to long acting insulin -Started on Insulin 70/30 due to cost, DM education completed -given scripts for DM supplies -made new PCP appt prior to DC for quick FU  Leukocytosis -Most likely stress demargination, negative fever, negative overt signs of infection  Acute kidney injury -secondary to dehydration/DKA -resolved  Tobacco abuse: -Did counseling about importance of quitting smoking   Hypokalemia -resolved     Discharge Exam: Vitals:   08/13/17 2128 08/14/17 0429  BP: (!) 125/92 119/84  Pulse: 99 71  Resp: 18 18  Temp: 99.6 F (37.6 C) 98.7 F (37.1 C)  SpO2: 96% 99%    General: AAOx3 Cardiovascular: S1S2/RRR Respiratory: CTAB  Discharge Instructions   Discharge Instructions    Diet Carb Modified   Complete by:  As directed    Increase activity slowly   Complete by:  As directed      Allergies as of 08/14/2017   No Known Allergies     Medication List     TAKE these medications   blood glucose meter kit and supplies Kit Dispense based on patient and insurance preference. Use up to four times daily as directed. (FOR ICD-9 250.00, 250.01).      Allergies  Allergen Reactions  . Novolin N [Insulin Nph (Human) (Isophane)] Hives   Follow-up Information    [facility] Go on 08/17/2017.   Why:  Dr.E Newlin at 10 am Contact information: [address] [place] [address] [phone]           The results of significant diagnostics from this hospitalization (including imaging, microbiology, ancillary and laboratory) are listed below for reference.    Significant Diagnostic Studies: No results found.  Microbiology: No results found for this or any previous visit (from the past 240 hour(s)).   Labs: Basic Metabolic Panel: No results for input(s): NA, K, CL, CO2, GLUCOSE, BUN, CREATININE, CALCIUM, MG, PHOS in the last 168 hours. Liver Function Tests: No results for input(s): AST, ALT, ALKPHOS, BILITOT, PROT, ALBUMIN in the last 168 hours. No results for input(s): LIPASE, AMYLASE in the last 168 hours. No results for input(s): AMMONIA in the last 168 hours. CBC: No results for input(s): WBC, NEUTROABS, HGB, HCT, MCV, PLT in the last 168 hours. Cardiac Enzymes: No results for input(s): CKTOTAL, CKMB, CKMBINDEX, TROPONINI in the last 168 hours. BNP: BNP (last 3 results) No results for input(s): BNP in the last 8760 hours.  ProBNP (last 3 results) No results for input(s): PROBNP in the last  8760 hours.  CBG: No results for input(s): GLUCAP in the last 168 hours.     Signed:  Domenic Polite MD.  [facility] 08/25/2017, 10:37 AM

## 2017-09-11 MED FILL — !LANTUS SOLOSTAR 100UNITS/M: 100 | 30 days supply | Qty: 6 | Fill #1

## 2017-10-12 MED FILL — !LANTUS SOLOSTAR 100UNITS/M: 100 | 30 days supply | Qty: 6 | Fill #2

## 2017-10-13 ENCOUNTER — Encounter: Payer: Self-pay | Admitting: Family Medicine

## 2017-10-13 ENCOUNTER — Ambulatory Visit: Payer: Self-pay | Attending: Family Medicine | Admitting: Family Medicine

## 2017-10-13 VITALS — BP 144/79 | HR 78 | Temp 98.0°F | Ht 72.0 in | Wt 247.2 lb

## 2017-10-13 DIAGNOSIS — Z794 Long term (current) use of insulin: Secondary | ICD-10-CM | POA: Insufficient documentation

## 2017-10-13 DIAGNOSIS — Z720 Tobacco use: Secondary | ICD-10-CM | POA: Insufficient documentation

## 2017-10-13 DIAGNOSIS — E119 Type 2 diabetes mellitus without complications: Secondary | ICD-10-CM | POA: Insufficient documentation

## 2017-10-13 LAB — GLUCOSE, POCT (MANUAL RESULT ENTRY): POC GLUCOSE: 102 mg/dL — AB (ref 70–99)

## 2017-10-13 NOTE — Progress Notes (Signed)
Subjective:  Patient ID: Johnathan Strickland., male    DOB: 10/08/1985  Age: 32 y.o. MRN: [id number]  CC: Diabetes   HPI Johnathan Strickland. is a 32 year old male with a history of type 2 diabetes mellitus newly diagnosed in 07/2016 with an A1c of 11.6 here for follow-up visit. At his last office visit NovoLog 70/30 has been discontinued due to an allergic reaction but he is currently doing well on Lantus and he has his blood sugar log with him which reveals fasting sugars in the 82-105 range and random sugars less than 180.  He denies visual concerns, hypoglycemia, neuropathy and had his eye exam today with [facility] when he was told he had no evidence of diabetic retinopathy.   He does not exercise regularly and has no additional concerns today.  Past Medical History:  Diagnosis Date  . Tobacco abuse     History reviewed. No pertinent surgical history.  Allergies  Allergen Reactions  . Novolin N [Insulin Nph (Human) (Isophane)] Hives     Outpatient Medications Prior to Visit  Medication Sig Dispense Refill  . Insulin Glargine (LANTUS SOLOSTAR) 100 UNIT/ML Solostar Pen Inject 20 Units into the skin daily at 10 pm. 5 pen 3  . blood glucose meter kit and supplies KIT Dispense based on patient and insurance preference. Use up to four times daily as directed. (FOR ICD-9 250.00, 250.01). (Patient not taking: Reported on 10/13/2017) 1 each 0  . predniSONE (DELTASONE) 5 MG tablet Take 1 tablet (5 mg total) by mouth daily with breakfast. (Patient not taking: Reported on 10/13/2017) 5 tablet 0   No facility-administered medications prior to visit.     ROS Review of Systems  Constitutional: Negative for activity change and appetite change.  HENT: Negative for sinus pressure and sore throat.   Eyes: Negative for visual disturbance.  Respiratory: Negative for cough, chest tightness and shortness of breath.   Cardiovascular: Negative for chest pain and leg swelling.    Gastrointestinal: Negative for abdominal distention, abdominal pain, constipation and diarrhea.  Endocrine: Negative.   Genitourinary: Negative for dysuria.  Musculoskeletal: Negative for joint swelling and myalgias.  Skin: Negative for rash.  Allergic/Immunologic: Negative.   Neurological: Negative for weakness, light-headedness and numbness.  Psychiatric/Behavioral: Negative for dysphoric mood and suicidal ideas.    Objective:  BP (!) 144/79   Pulse 78   Temp 98 F (36.7 C) (Oral)   Ht 6' (1.829 m)   Wt 247 lb 3.2 oz (112.1 kg)   SpO2 98%   BMI 33.53 kg/m   BP/Weight 10/13/2017 08/17/2017 1/94/1740  Systolic BP 814 481 856  Diastolic BP 79 78 84  Wt. (Lbs) 247.2 259 -  BMI 33.53 35.13 -      Physical Exam  Constitutional: He is oriented to person, place, and time. He appears well-developed and well-nourished.  Cardiovascular: Normal rate, normal heart sounds and intact distal pulses.  No murmur heard. Pulmonary/Chest: Effort normal and breath sounds normal. He has no wheezes. He has no rales. He exhibits no tenderness.  Abdominal: Soft. Bowel sounds are normal. He exhibits no distension and no mass. There is no tenderness.  Musculoskeletal: Normal range of motion.  Neurological: He is alert and oriented to person, place, and time.  Skin: Skin is warm and dry.  Psychiatric: He has a normal mood and affect.     Lab Results  Component Value Date   HGBA1C 11.6 (H) 08/12/2017    Assessment & Plan:  1. New onset type 2 diabetes mellitus ([place]) Uncontrolled with A1c of 11.6 but blood sugar logs reveal improvement No regimen change at this time Counseled on Diabetic diet, my plate method, 159 minutes of moderate intensity exercise/week Keep blood sugar logs with fasting goals of 80-120 mg/dl, random of less than 180 and in the event of sugars less than 60 mg/dl or greater than 400 mg/dl please notify the clinic ASAP. It is recommended that you undergo annual eye exams  and annual foot exams. Pneumonia vaccine is recommended. Elevated blood pressure, no previous history of hypertension - lifestyle modifications and I will reassess at his next visit. - POCT glucose (manual entry) - CMP14+EGFR - Microalbumin/Creatinine Ratio, Urine - Hemoglobin A1c   No orders of the defined types were placed in this encounter.   Follow-up: Return in about 3 months (around 01/13/2018) for Follow-up on diabetes mellitus.   Charlott Rakes MD

## 2017-10-13 NOTE — Patient Instructions (Signed)
Diabetes Mellitus and Nutrition When you have diabetes (diabetes mellitus), it is very important to have healthy eating habits because your blood sugar (glucose) levels are greatly affected by what you eat and drink. Eating healthy foods in the appropriate amounts, at about the same times every day, can help you:  Control your blood glucose.  Lower your risk of heart disease.  Improve your blood pressure.  Reach or maintain a healthy weight.  Every person with diabetes is different, and each person has different needs for a meal plan. Your health care provider may recommend that you work with a diet and nutrition specialist (dietitian) to make a meal plan that is best for you. Your meal plan may vary depending on factors such as:  The calories you need.  The medicines you take.  Your weight.  Your blood glucose, blood pressure, and cholesterol levels.  Your activity level.  Other health conditions you have, such as heart or kidney disease.  How do carbohydrates affect me? Carbohydrates affect your blood glucose level more than any other type of food. Eating carbohydrates naturally increases the amount of glucose in your blood. Carbohydrate counting is a method for keeping track of how many carbohydrates you eat. Counting carbohydrates is important to keep your blood glucose at a healthy level, especially if you use insulin or take certain oral diabetes medicines. It is important to know how many carbohydrates you can safely have in each meal. This is different for every person. Your dietitian can help you calculate how many carbohydrates you should have at each meal and for snack. Foods that contain carbohydrates include:  Bread, cereal, rice, pasta, and crackers.  Potatoes and corn.  Peas, beans, and lentils.  Milk and yogurt.  Fruit and juice.  Desserts, such as cakes, cookies, ice cream, and candy.  How does alcohol affect me? Alcohol can cause a sudden decrease in blood  glucose (hypoglycemia), especially if you use insulin or take certain oral diabetes medicines. Hypoglycemia can be a life-threatening condition. Symptoms of hypoglycemia (sleepiness, dizziness, and confusion) are similar to symptoms of having too much alcohol. If your health care provider says that alcohol is safe for you, follow these guidelines:  Limit alcohol intake to no more than 1 drink per day for nonpregnant women and 2 drinks per day for men. One drink equals 12 oz of beer, 5 oz of wine, or 1 oz of hard liquor.  Do not drink on an empty stomach.  Keep yourself hydrated with water, diet soda, or unsweetened iced tea.  Keep in mind that regular soda, juice, and other mixers may contain a lot of sugar and must be counted as carbohydrates.  What are tips for following this plan? Reading food labels  Start by checking the serving size on the label. The amount of calories, carbohydrates, fats, and other nutrients listed on the label are based on one serving of the food. Many foods contain more than one serving per package.  Check the total grams (g) of carbohydrates in one serving. You can calculate the number of servings of carbohydrates in one serving by dividing the total carbohydrates by 15. For example, if a food has 30 g of total carbohydrates, it would be equal to 2 servings of carbohydrates.  Check the number of grams (g) of saturated and trans fats in one serving. Choose foods that have low or no amount of these fats.  Check the number of milligrams (mg) of sodium in one serving. Most people   should limit total sodium intake to less than 2,300 mg per day.  Always check the nutrition information of foods labeled as "low-fat" or "nonfat". These foods may be higher in added sugar or refined carbohydrates and should be avoided.  Talk to your dietitian to identify your daily goals for nutrients listed on the label. Shopping  Avoid buying canned, premade, or processed foods. These  foods tend to be high in fat, sodium, and added sugar.  Shop around the outside edge of the grocery store. This includes fresh fruits and vegetables, bulk grains, fresh meats, and fresh dairy. Cooking  Use low-heat cooking methods, such as baking, instead of high-heat cooking methods like deep frying.  Cook using healthy oils, such as olive, canola, or sunflower oil.  Avoid cooking with butter, cream, or high-fat meats. Meal planning  Eat meals and snacks regularly, preferably at the same times every day. Avoid going long periods of time without eating.  Eat foods high in fiber, such as fresh fruits, vegetables, beans, and whole grains. Talk to your dietitian about how many servings of carbohydrates you can eat at each meal.  Eat 4-6 ounces of lean protein each day, such as lean meat, chicken, fish, eggs, or tofu. 1 ounce is equal to 1 ounce of meat, chicken, or fish, 1 egg, or 1/4 cup of tofu.  Eat some foods each day that contain healthy fats, such as avocado, nuts, seeds, and fish. Lifestyle   Check your blood glucose regularly.  Exercise at least 30 minutes 5 or more days each week, or as told by your health care provider.  Take medicines as told by your health care provider.  Do not use any products that contain nicotine or tobacco, such as cigarettes and e-cigarettes. If you need help quitting, ask your health care provider.  Work with a counselor or diabetes educator to identify strategies to manage stress and any emotional and social challenges. What are some questions to ask my health care provider?  Do I need to meet with a diabetes educator?  Do I need to meet with a dietitian?  What number can I call if I have questions?  When are the best times to check my blood glucose? Where to find more information:  American Diabetes Association: diabetes.org/food-and-fitness/food  Academy of Nutrition and Dietetics:  www.eatright.org/resources/health/diseases-and-conditions/diabetes  National Institute of Diabetes and Digestive and Kidney Diseases (NIH): www.niddk.nih.gov/health-information/diabetes/overview/diet-eating-physical-activity Summary  A healthy meal plan will help you control your blood glucose and maintain a healthy lifestyle.  Working with a diet and nutrition specialist (dietitian) can help you make a meal plan that is best for you.  Keep in mind that carbohydrates and alcohol have immediate effects on your blood glucose levels. It is important to count carbohydrates and to use alcohol carefully. This information is not intended to replace advice given to you by your health care provider. Make sure you discuss any questions you have with your health care provider. Document Released: 12/12/2004 Document Revised: 04/21/2016 Document Reviewed: 04/21/2016 Elsevier Interactive Patient Education  2018 Elsevier Inc.  

## 2017-10-14 LAB — CMP14+EGFR
A/G RATIO: 1.5 (ref 1.2–2.2)
ALT: 26 IU/L (ref 0–44)
AST: 20 IU/L (ref 0–40)
Albumin: 4.3 g/dL (ref 3.5–5.5)
Alkaline Phosphatase: 57 IU/L (ref 39–117)
BUN/Creatinine Ratio: 8 — ABNORMAL LOW (ref 9–20)
BUN: 11 mg/dL (ref 6–20)
Bilirubin Total: 0.3 mg/dL (ref 0.0–1.2)
CALCIUM: 9.5 mg/dL (ref 8.7–10.2)
CO2: 20 mmol/L (ref 20–29)
CREATININE: 1.39 mg/dL — AB (ref 0.76–1.27)
Chloride: 106 mmol/L (ref 96–106)
GFR, EST AFRICAN AMERICAN: 77 mL/min/{1.73_m2} (ref >59)
GFR, EST NON AFRICAN AMERICAN: 67 mL/min/{1.73_m2} (ref >59)
GLOBULIN, TOTAL: 2.8 g/dL (ref 1.5–4.5)
Glucose: 71 mg/dL (ref 65–99)
POTASSIUM: 4.4 mmol/L (ref 3.5–5.2)
Sodium: 142 mmol/L (ref 134–144)
TOTAL PROTEIN: 7.1 g/dL (ref 6.0–8.5)

## 2017-10-14 LAB — MICROALBUMIN / CREATININE URINE RATIO
Creatinine, Urine: 208.6 mg/dL
MICROALB/CREAT RATIO: 1.9 mg/g{creat} (ref 0.0–30.0)
Microalbumin, Urine: 4 ug/mL

## 2017-10-14 LAB — HEMOGLOBIN A1C
ESTIMATED AVERAGE GLUCOSE: 151 mg/dL
HEMOGLOBIN A1C: 6.9 % — AB (ref 4.8–5.6)

## 2017-10-15 ENCOUNTER — Other Ambulatory Visit: Payer: Self-pay

## 2017-10-15 ENCOUNTER — Telehealth: Payer: Self-pay

## 2017-10-15 MED ORDER — TETANUS-DIPHTH-ACELL PERTUSSIS 5-2.5-18.5 LF-MCG/0.5 IM SUSP: 0.5000 mL | INTRAMUSCULAR | 0 refills | Status: DC

## 2017-10-15 NOTE — Progress Notes (Signed)
Re-orders due to problem with epic printing

## 2017-10-15 NOTE — Telephone Encounter (Signed)
Patient returned phone call and was informed of lab results. 

## 2017-10-15 NOTE — Telephone Encounter (Signed)
Patient was called and a voicemail was left informing patient to return phone call for lab results. 

## 2017-12-24 MED FILL — !LANTUS SOLOSTAR 100UNITS/M: 100 | 30 days supply | Qty: 6 | Fill #3

## 2018-01-11 ENCOUNTER — Ambulatory Visit: Payer: Self-pay | Attending: Family Medicine | Admitting: Family Medicine

## 2018-01-11 ENCOUNTER — Encounter: Payer: Self-pay | Admitting: Family Medicine

## 2018-01-11 VITALS — BP 117/74 | HR 66 | Temp 97.4°F | Ht 72.0 in | Wt 235.0 lb

## 2018-01-11 DIAGNOSIS — H6122 Impacted cerumen, left ear: Secondary | ICD-10-CM | POA: Insufficient documentation

## 2018-01-11 DIAGNOSIS — E119 Type 2 diabetes mellitus without complications: Secondary | ICD-10-CM | POA: Insufficient documentation

## 2018-01-11 DIAGNOSIS — Z794 Long term (current) use of insulin: Secondary | ICD-10-CM | POA: Insufficient documentation

## 2018-01-11 DIAGNOSIS — Z79899 Other long term (current) drug therapy: Secondary | ICD-10-CM | POA: Insufficient documentation

## 2018-01-11 LAB — POCT GLYCOSYLATED HEMOGLOBIN (HGB A1C): HbA1c, POC (controlled diabetic range): 5.3 % (ref 0.0–7.0)

## 2018-01-11 LAB — GLUCOSE, POCT (MANUAL RESULT ENTRY): POC Glucose: 174 mg/dl — AB (ref 70–99)

## 2018-01-11 MED ORDER — INSULIN GLARGINE 100 UNIT/ML SOLOSTAR PEN: 18.0000 [IU] | PEN_INJECTOR | Freq: Every day | SUBCUTANEOUS | 6 refills | Status: DC

## 2018-01-11 NOTE — Progress Notes (Signed)
Subjective:  Patient ID: Johnathan Strickland., male    DOB: [birth year]  Age: 32 y.o. MRN: [id number]  CC: Diabetes   HPI [place] Souter. is a 32 year old male with a history of type 2 diabetes mellitus A1c of 5.3 today who was diagnosed 6 months ago with an A1c of 11.6. His blood sugars have improved significantly to the point where he had to reduce his Lantus from 20 units to 18 units at bedtime and his fasting blood sugars have remained in the 80-100 range. He has noticed some fatigue when he does exert himself with activities like washing his car and he has to eat to improve his energy. Denies numbness in extremities, visual concerns or hypoglycemic symptoms. He has no additional concerns today.   Past Medical History:  Diagnosis Date  . Tobacco abuse     History reviewed. No pertinent surgical history.  Allergies  Allergen Reactions  . Novolin N [Insulin Nph (Human) (Isophane)] Hives     Outpatient Medications Prior to Visit  Medication Sig Dispense Refill  . Insulin Glargine (LANTUS SOLOSTAR) 100 UNIT/ML Solostar Pen Inject 20 Units into the skin daily at 10 pm. 5 pen 3  . blood glucose meter kit and supplies KIT Dispense based on patient and insurance preference. Use up to four times daily as directed. (FOR ICD-9 250.00, 250.01). (Patient not taking: Reported on 10/13/2017) 1 each 0  . predniSONE (DELTASONE) 5 MG tablet Take 1 tablet (5 mg total) by mouth daily with breakfast. (Patient not taking: Reported on 10/13/2017) 5 tablet 0  . Tdap (BOOSTRIX) 5-2.5-18.5 LF-MCG/0.5 injection Inject 0.5 mLs into the muscle as directed. 0.5 mL 0   No facility-administered medications prior to visit.     ROS Review of Systems  Constitutional: Negative for activity change and appetite change.  HENT: Negative for sinus pressure and sore throat.   Eyes: Negative for visual disturbance.  Respiratory: Negative for cough, chest tightness and shortness of breath.     Cardiovascular: Negative for chest pain and leg swelling.  Gastrointestinal: Negative for abdominal distention, abdominal pain, constipation and diarrhea.  Endocrine: Negative.   Genitourinary: Negative for dysuria.  Musculoskeletal: Negative for joint swelling and myalgias.  Skin: Negative for rash.  Allergic/Immunologic: Negative.   Neurological: Negative for weakness, light-headedness and numbness.  Psychiatric/Behavioral: Negative for dysphoric mood and suicidal ideas.    Objective:  BP 117/74   Pulse 66   Temp (!) 97.4 F (36.3 C) (Oral)   Ht 6' (1.829 m)   Wt 235 lb (106.6 kg)   SpO2 98%   BMI 31.87 kg/m   BP/Weight 01/11/2018 10/13/2017 9/56/2130  Systolic BP 865 784 696  Diastolic BP 74 79 78  Wt. (Lbs) 235 247.2 259  BMI 31.87 33.53 35.13      Physical Exam  Constitutional: He is oriented to person, place, and time. He appears well-developed and well-nourished.  HENT:  Right Ear: External ear normal.  Left Ear: External ear normal.  Mouth/Throat: Oropharynx is clear and moist.  Cerumen obscuring left tympanic membrane  Cardiovascular: Normal rate, normal heart sounds and intact distal pulses.  No murmur heard. Pulmonary/Chest: Effort normal and breath sounds normal. He has no wheezes. He has no rales. He exhibits no tenderness.  Abdominal: Soft. Bowel sounds are normal. He exhibits no distension and no mass. There is no tenderness.  Musculoskeletal: Normal range of motion.  Neurological: He is alert and oriented to person, place, and time.  Skin: Skin is  warm and dry.  Psychiatric: He has a normal mood and affect.     CMP Latest Ref Rng & Units 10/13/2017 08/14/2017 08/13/2017  Glucose 65 - 99 mg/dL 71 149(H) 211(H)  BUN 6 - 20 mg/dL 11 7 6   Creatinine 0.76 - 1.27 mg/dL 1.39(H) 1.26(H) 1.10  Sodium 134 - 144 mmol/L 142 139 139  Potassium 3.5 - 5.2 mmol/L 4.4 3.3(L) 3.6  Chloride 96 - 106 mmol/L 106 102 105  CO2 20 - 29 mmol/L 20 26 26   Calcium 8.7 -  10.2 mg/dL 9.5 9.0 8.7(L)  Total Protein 6.0 - 8.5 g/dL 7.1 - -  Total Bilirubin 0.0 - 1.2 mg/dL 0.3 - -  Alkaline Phos 39 - 117 IU/L 57 - -  AST 0 - 40 IU/L 20 - -  ALT 0 - 44 IU/L 26 - -    Lipid Panel     Component Value Date/Time   CHOL 192 08/12/2017 0633   TRIG 226 (H) 08/12/2017 0633   HDL 29 (L) 08/12/2017 0633   CHOLHDL 6.6 08/12/2017 0633   VLDL 45 (H) 08/12/2017 0633   LDLCALC 118 (H) 08/12/2017 0633    Lab Results  Component Value Date   HGBA1C 5.3 01/11/2018     Assessment & Plan:   1. New onset type 2 diabetes mellitus ([facility]) Significant improvement in A1C - controlled at 5.3 Discussed management of hypoglycemia and he knows to reduce Lantus dose by 2 units. - POCT glucose (manual entry) - POCT glycosylated hemoglobin (Hb A1C) - Insulin Glargine (LANTUS SOLOSTAR) 100 UNIT/ML Solostar Pen; Inject 18 Units into the skin daily at 10 pm.  Dispense: 5 pen; Refill: 6  2. Cerumen debris on tympanic membrane of left ear Ear irrigation performed   Declines flu shot  Meds ordered this encounter  Medications  . Insulin Glargine (LANTUS SOLOSTAR) 100 UNIT/ML Solostar Pen    Sig: Inject 18 Units into the skin daily at 10 pm.    Dispense:  5 pen    Refill:  6    Discontinue NovoLog 70/30    Follow-up: Return in about 6 months (around 07/13/2018) for follow up on diabetes mellitus.   Charlott Rakes MD

## 2018-01-11 NOTE — Patient Instructions (Signed)

## 2018-02-04 MED FILL — $LANTUS SOLOSTAR 100 UNITS/: 100 | 82 days supply | Qty: 15 | Fill #0

## 2018-04-19 MED FILL — $LANTUS SOLOSTAR 100 UNITS/: 100 | 82 days supply | Qty: 15 | Fill #1

## 2018-06-29 MED FILL — $LANTUS SOLOSTAR 100 UNITS/: 100 | 82 days supply | Qty: 15 | Fill #2

## 2018-07-26 ENCOUNTER — Telehealth: Payer: Self-pay

## 2018-07-26 NOTE — Telephone Encounter (Signed)
Error

## 2018-07-27 ENCOUNTER — Other Ambulatory Visit: Payer: Self-pay

## 2018-07-27 ENCOUNTER — Ambulatory Visit: Payer: Self-pay | Attending: Family Medicine | Admitting: Family Medicine

## 2018-07-27 ENCOUNTER — Encounter: Payer: Self-pay | Admitting: Family Medicine

## 2018-07-27 DIAGNOSIS — R0789 Other chest pain: Secondary | ICD-10-CM

## 2018-07-27 DIAGNOSIS — E119 Type 2 diabetes mellitus without complications: Secondary | ICD-10-CM

## 2018-07-27 MED ORDER — INSULIN GLARGINE 100 UNIT/ML SOLOSTAR PEN: 18.0000 [IU] | PEN_INJECTOR | Freq: Every day | SUBCUTANEOUS | 6 refills | Status: DC

## 2018-07-27 MED FILL — $LANTUS SOLOSTAR 100 UNITS/: 100 | 33 days supply | Qty: 6 | Fill #0

## 2018-07-27 NOTE — Progress Notes (Signed)
Patient has been called and DOB has been verified. Patient has been screened and transferred to PCP to start phone visit.  C/C: Diabetes   

## 2018-07-27 NOTE — Progress Notes (Signed)
Virtual Visit via Telephone Note  I connected with Johnathan Strickland., on 07/27/2018 at 2:46 PM by telephone and verified that I am speaking with the correct person using two identifiers.   Consent: I discussed the limitations, risks, security and privacy concerns of performing an evaluation and management service by telephone and the availability of in person appointments. I also discussed with the patient that there may be a patient responsible charge related to this service. The patient expressed understanding and agreed to proceed.   Location of Patient: Home  Location of Provider: Clinic   Persons participating in Telemedicine visit: Mcdonald Reiling.  Ozzie Hoyle Dr. Felecia Shelling     History of Present Illness: Johnathan Strickland. is a 33 year old male with a history of type 2 diabetes mellitus A1c of 5.3 in 12/2017 seen for follow-up visit.  His blood sugar at home have been in the range of 100-120 and he denies hypoglycemic episodes, visual concerns, neuropathy. He endorses compliance with current dose of Lantus. He mentions episodes of intermittent chest pain the last of which occurred 1 month ago while he was driving and lasted for a few seconds then resolved.  This was not associated with diaphoresis, radiation of pain, dyspnea and is absent at this time.   Past Medical History:  Diagnosis Date  . Tobacco abuse    Allergies  Allergen Reactions  . Novolin N [Insulin Nph (Human) (Isophane)] Hives    Current Outpatient Medications on File Prior to Visit  Medication Sig Dispense Refill  . Insulin Glargine (LANTUS SOLOSTAR) 100 UNIT/ML Solostar Pen Inject 18 Units into the skin daily at 10 pm. 5 pen 6  . blood glucose meter kit and supplies KIT Dispense based on patient and insurance preference. Use up to four times daily as directed. (FOR ICD-9 250.00, 250.01). (Patient not taking: Reported on 10/13/2017) 1 each 0  . predniSONE (DELTASONE) 5 MG  tablet Take 1 tablet (5 mg total) by mouth daily with breakfast. (Patient not taking: Reported on 10/13/2017) 5 tablet 0   No current facility-administered medications on file prior to visit.     Observations/Objective: Awake, alert, oriented x3 Not in acute distress  CMP Latest Ref Rng & Units 10/13/2017 08/14/2017 08/13/2017  Glucose 65 - 99 mg/dL 71 149(H) 211(H)  BUN 6 - 20 mg/dL 11 7 6   Creatinine 0.76 - 1.27 mg/dL 1.39(H) 1.26(H) 1.10  Sodium 134 - 144 mmol/L 142 139 139  Potassium 3.5 - 5.2 mmol/L 4.4 3.3(L) 3.6  Chloride 96 - 106 mmol/L 106 102 105  CO2 20 - 29 mmol/L 20 26 26   Calcium 8.7 - 10.2 mg/dL 9.5 9.0 8.7(L)  Total Protein 6.0 - 8.5 g/dL 7.1 - -  Total Bilirubin 0.0 - 1.2 mg/dL 0.3 - -  Alkaline Phos 39 - 117 IU/L 57 - -  AST 0 - 40 IU/L 20 - -  ALT 0 - 44 IU/L 26 - -    Lipid Panel     Component Value Date/Time   CHOL 192 08/12/2017 0633   TRIG 226 (H) 08/12/2017 0633   HDL 29 (L) 08/12/2017 0633   CHOLHDL 6.6 08/12/2017 0633   VLDL 45 (H) 08/12/2017 0633   LDLCALC 118 (H) 08/12/2017 0633    Lab Results  Component Value Date   HGBA1C 5.3 01/11/2018    Assessment and Plan: 1. New onset type 2 diabetes mellitus ([place]) Controlled with A1c of 5.3 Continue current regimen Counseled on Diabetic diet, my plate  method, 150 minutes of moderate intensity exercise/week Keep blood sugar logs with fasting goals of 80-120 mg/dl, random of less than 180 and in the event of sugars less than 60 mg/dl or greater than 400 mg/dl please notify the clinic ASAP. It is recommended that you undergo annual eye exams and annual foot exams. Pneumonia vaccine is recommended. - Insulin Glargine (LANTUS SOLOSTAR) 100 UNIT/ML Solostar Pen; Inject 18 Units into the skin daily at 10 pm.  Dispense: 5 pen; Refill: 6  2. Atypical chest pain Absent at this time -suspicious for musculoskeletal origin Discussed ED precautions and advised he will need an EKG and to present to the ED if this  recurs.   Follow Up Instructions: Return in about 3 months (around 10/26/2018) for medical conditions and labs.    I discussed the assessment and treatment plan with the patient. The patient was provided an opportunity to ask questions and all were answered. The patient agreed with the plan and demonstrated an understanding of the instructions.   The patient was advised to call back or seek an in-person evaluation if the symptoms worsen or if the condition fails to improve as anticipated.     I provided 15 minutes total of non-face-to-face time during this encounter including median intraservice time, reviewing previous notes, labs, imaging, medications and explaining diagnosis and management.     Charlott Rakes, MD, FAAFP. [facility] and [facility], [place]   07/27/2018, 2:46 PM

## 2018-11-02 MED FILL — $LANTUS SOLOSTAR 100 UNITS/: 100 | 82 days supply | Qty: 15 | Fill #3

## 2018-11-22 ENCOUNTER — Encounter: Payer: Self-pay | Admitting: Family Medicine

## 2018-11-22 ENCOUNTER — Ambulatory Visit: Payer: Self-pay | Attending: Family Medicine | Admitting: Family Medicine

## 2018-11-22 ENCOUNTER — Other Ambulatory Visit: Payer: Self-pay

## 2018-11-22 VITALS — BP 153/89 | HR 103 | Temp 98.5°F | Ht 72.0 in | Wt 243.0 lb

## 2018-11-22 DIAGNOSIS — E1169 Type 2 diabetes mellitus with other specified complication: Secondary | ICD-10-CM

## 2018-11-22 DIAGNOSIS — Z794 Long term (current) use of insulin: Secondary | ICD-10-CM

## 2018-11-22 DIAGNOSIS — E1165 Type 2 diabetes mellitus with hyperglycemia: Secondary | ICD-10-CM

## 2018-11-22 DIAGNOSIS — B353 Tinea pedis: Secondary | ICD-10-CM

## 2018-11-22 LAB — POCT GLYCOSYLATED HEMOGLOBIN (HGB A1C): HbA1c, POC (controlled diabetic range): 5.8 % (ref 0.0–7.0)

## 2018-11-22 LAB — GLUCOSE, POCT (MANUAL RESULT ENTRY): POC Glucose: 180 mg/dl — AB (ref 70–99)

## 2018-11-22 MED ORDER — LANTUS SOLOSTAR 100 UNIT/ML ~~LOC~~ SOPN
15.0000 [IU] | PEN_INJECTOR | Freq: Every day | SUBCUTANEOUS | 6 refills | Status: AC
Start: 2018-11-22 — End: ?

## 2018-11-22 MED ORDER — TERBINAFINE HCL 1 % EX CREA: 1.0000 "application " | TOPICAL_CREAM | Freq: Two times a day (BID) | CUTANEOUS | 0 refills | Status: AC

## 2018-11-22 NOTE — Progress Notes (Signed)
Subjective:  Patient ID: Johnathan Chancy., male    DOB: 09-06-1985  Age: 33 y.o. MRN: [id number]  CC: Diabetes   HPI Johnathan Fenter. is a 33 year old male with a history of type 2 diabetes mellitus A1c of 5.8 seen for follow-up visit.  His fasting sugars have been around 110 and he denies hypoglycemia.  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 His upcoming eye exam is next month. He denies chest pain, blurry vision, numbness in extremities and has no additional concerns today.  Past Medical History:  Diagnosis Date  . Tobacco abuse     History reviewed. No pertinent surgical history.  Family History  Problem Relation Age of Onset  . Diabetes Mellitus II Father     Allergies  Allergen Reactions  . Novolin N [Insulin Nph (Human) (Isophane)] Hives    Outpatient Medications Prior to Visit  Medication Sig Dispense Refill  . Insulin Glargine (LANTUS SOLOSTAR) 100 UNIT/ML Solostar Pen Inject 18 Units into the skin daily at 10 pm. 5 pen 6  . blood glucose meter kit and supplies KIT Dispense based on patient and insurance preference. Use up to four times daily as directed. (FOR ICD-9 250.00, 250.01). (Patient not taking: Reported on 10/13/2017) 1 each 0  . predniSONE (DELTASONE) 5 MG tablet Take 1 tablet (5 mg total) by mouth daily with breakfast. (Patient not taking: Reported on 10/13/2017) 5 tablet 0   No facility-administered medications prior to visit.      ROS Review of Systems  Constitutional: Negative for activity change and appetite change.  HENT: Negative for sinus pressure and sore throat.   Eyes: Negative for visual disturbance.  Respiratory: Negative for cough, chest tightness and shortness of breath.   Cardiovascular: Negative for chest pain and leg swelling.  Gastrointestinal: Negative for abdominal distention, abdominal pain,  constipation and diarrhea.  Endocrine: Negative.   Genitourinary: Negative for dysuria.  Musculoskeletal: Negative for joint swelling and myalgias.  Skin: Negative for rash.  Allergic/Immunologic: Negative.   Neurological: Negative for weakness, light-headedness and numbness.  Psychiatric/Behavioral: Negative for dysphoric mood and suicidal ideas.    Objective:  BP (!) 153/89   Pulse (!) 103   Temp 98.5 F (36.9 C) (Oral)   Ht 6' (1.829 m)   Wt 243 lb (110.2 kg)   SpO2 97%   BMI 32.96 kg/m   BP/Weight 11/22/2018 01/11/2018 6/60/6301  Systolic BP 601 093 235  Diastolic BP 89 74 79  Wt. (Lbs) 243 235 247.2  BMI 32.96 31.87 33.53      Physical Exam Constitutional:      Appearance: He is well-developed.  Cardiovascular:     Rate and Rhythm: Normal rate.     Heart sounds: Normal heart sounds. No murmur.  Pulmonary:     Effort: Pulmonary effort is normal.     Breath sounds: Normal breath sounds. No wheezing or rales.  Chest:     Chest wall: No tenderness.  Abdominal:     General: Bowel sounds are normal. There is no distension.     Palpations: Abdomen is soft. There is no mass.     Tenderness: There is no abdominal tenderness.  Musculoskeletal: Normal range of motion.  Neurological:     Mental Status: He is alert and oriented to person, place, and time.     CMP Latest Ref Rng &  Units 10/13/2017 08/14/2017 08/13/2017  Glucose 65 - 99 mg/dL 71 149(H) 211(H)  BUN 6 - 20 mg/dL 11 7 6   Creatinine 0.76 - 1.27 mg/dL 1.39(H) 1.26(H) 1.10  Sodium 134 - 144 mmol/L 142 139 139  Potassium 3.5 - 5.2 mmol/L 4.4 3.3(L) 3.6  Chloride 96 - 106 mmol/L 106 102 105  CO2 20 - 29 mmol/L 20 26 26   Calcium 8.7 - 10.2 mg/dL 9.5 9.0 8.7(L)  Total Protein 6.0 - 8.5 g/dL 7.1 - -  Total Bilirubin 0.0 - 1.2 mg/dL 0.3 - -  Alkaline Phos 39 - 117 IU/L 57 - -  AST 0 - 40 IU/L 20 - -  ALT 0 - 44 IU/L 26 - -    Lipid Panel     Component Value Date/Time   CHOL 192 08/12/2017 0633   TRIG 226  (H) 08/12/2017 0633   HDL 29 (L) 08/12/2017 0633   CHOLHDL 6.6 08/12/2017 0633   VLDL 45 (H) 08/12/2017 0633   LDLCALC 118 (H) 08/12/2017 0633    CBC    Component Value Date/Time   WBC 7.5 08/14/2017 0610   RBC 4.50 08/14/2017 0610   HGB 12.9 (L) 08/14/2017 0610   HCT 37.9 (L) 08/14/2017 0610   PLT 193 08/14/2017 0610   MCV 84.2 08/14/2017 0610   MCH 28.7 08/14/2017 0610   MCHC 34.0 08/14/2017 0610   RDW 12.6 08/14/2017 0610   LYMPHSABS 2.1 03/13/2008 1915   MONOABS 1.1 (H) 03/13/2008 1915   EOSABS 0.1 03/13/2008 1915   BASOSABS 0.1 03/13/2008 1915    Lab Results  Component Value Date   HGBA1C 5.8 11/22/2018    Assessment & Plan:   1. Type 2 diabetes mellitus with other specified complication, with long-term current use of insulin (HCC) Controlled with A1c of 5.8 Decrease Lantus dose to 15 units at bedtime to prevent hypoglycemia Counseled on Diabetic diet, my plate method, 350 minutes of moderate intensity exercise/week Keep blood sugar logs with fasting goals of 80-120 mg/dl, random of less than 180 and in the event of sugars less than 60 mg/dl or greater than 400 mg/dl please notify the clinic ASAP. It is recommended that you undergo annual eye exams and annual foot exams. Pneumonia vaccine is recommended. - POCT glucose (manual entry) - POCT glycosylated hemoglobin (Hb A1C) - Microalbumin/Creatinine Ratio, Urine - Basic Metabolic Panel - Insulin Glargine (LANTUS SOLOSTAR) 100 UNIT/ML Solostar Pen; Inject 15 Units into the skin daily at 10 pm.  Dispense: 5 pen; Refill: 6  2. Tinea pedis of right foot Foot care discussed - terbinafine (LAMISIL AT) 1 % cream; Apply 1 application topically 2 (two) times daily.  Dispense: 30 g; Refill: 0   Healthcare maintenance-has upcoming eye exam next month  Meds ordered this encounter  Medications  . Insulin Glargine (LANTUS SOLOSTAR) 100 UNIT/ML Solostar Pen    Sig: Inject 15 Units into the skin daily at 10 pm.     Dispense:  5 pen    Refill:  6    Discontinue previous dose  . terbinafine (LAMISIL AT) 1 % cream    Sig: Apply 1 application topically 2 (two) times daily.    Dispense:  30 g    Refill:  0    Follow-up: Return in about 6 months (around 05/25/2019) for medical conditions.       Charlott Rakes, MD, FAAFP. [facility] and [facility], [place]   11/22/2018, 2:02 PM

## 2018-11-23 LAB — BASIC METABOLIC PANEL
BUN/Creatinine Ratio: 8 — ABNORMAL LOW (ref 9–20)
BUN: 10 mg/dL (ref 6–20)
CO2: 26 mmol/L (ref 20–29)
Calcium: 9.6 mg/dL (ref 8.7–10.2)
Chloride: 100 mmol/L (ref 96–106)
Creatinine, Ser: 1.25 mg/dL (ref 0.76–1.27)
GFR calc Af Amer: 87 mL/min/{1.73_m2} (ref >59)
GFR calc non Af Amer: 75 mL/min/{1.73_m2} (ref >59)
Glucose: 153 mg/dL — ABNORMAL HIGH (ref 65–99)
Potassium: 4 mmol/L (ref 3.5–5.2)
Sodium: 142 mmol/L (ref 134–144)

## 2018-11-23 LAB — MICROALBUMIN / CREATININE URINE RATIO
Creatinine, Urine: 261.6 mg/dL
Microalb/Creat Ratio: 5 mg/g creat (ref 0–29)
Microalbumin, Urine: 13.4 ug/mL

## 2018-11-25 ENCOUNTER — Telehealth: Payer: Self-pay

## 2018-11-25 NOTE — Telephone Encounter (Signed)
-----   Message from Charlott Rakes, MD sent at 11/23/2018  1:30 PM EDT ----- Please inform the patient that labs are normal. Thank you.

## 2018-11-25 NOTE — Telephone Encounter (Signed)
Patient was called and the phone number has a busy signal.

## 2019-02-03 MED FILL — !LANTUS SOLOSTAR 100UNITS/M: 100 | 20 days supply | Qty: 3 | Fill #0

## 2019-03-02 MED FILL — ?BASAGLAR 100 UNITS/ML KWPE: 100 | 20 days supply | Qty: 3 | Fill #1

## 2019-04-27 MED FILL — ?BASAGLAR 100 UNITS/ML KWPE: 100 | 20 days supply | Qty: 3 | Fill #2

## 2019-07-27 ENCOUNTER — Other Ambulatory Visit: Payer: Self-pay

## 2019-07-27 ENCOUNTER — Emergency Department: Payer: Self-pay

## 2019-07-27 DIAGNOSIS — E119 Type 2 diabetes mellitus without complications: Secondary | ICD-10-CM | POA: Insufficient documentation

## 2019-07-27 DIAGNOSIS — F17210 Nicotine dependence, cigarettes, uncomplicated: Secondary | ICD-10-CM | POA: Insufficient documentation

## 2019-07-27 DIAGNOSIS — Z79899 Other long term (current) drug therapy: Secondary | ICD-10-CM | POA: Insufficient documentation

## 2019-07-27 DIAGNOSIS — Y929 Unspecified place or not applicable: Secondary | ICD-10-CM | POA: Insufficient documentation

## 2019-07-27 DIAGNOSIS — Z794 Long term (current) use of insulin: Secondary | ICD-10-CM | POA: Insufficient documentation

## 2019-07-27 DIAGNOSIS — W2209XA Striking against other stationary object, initial encounter: Secondary | ICD-10-CM | POA: Insufficient documentation

## 2019-07-27 DIAGNOSIS — S62317A Displaced fracture of base of fifth metacarpal bone. left hand, initial encounter for closed fracture: Secondary | ICD-10-CM | POA: Insufficient documentation

## 2019-07-27 DIAGNOSIS — Y939 Activity, unspecified: Secondary | ICD-10-CM | POA: Insufficient documentation

## 2019-07-27 DIAGNOSIS — Y999 Unspecified external cause status: Secondary | ICD-10-CM | POA: Insufficient documentation

## 2019-07-27 DIAGNOSIS — S62316A Displaced fracture of base of fifth metacarpal bone, right hand, initial encounter for closed fracture: Secondary | ICD-10-CM | POA: Insufficient documentation
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
## 2019-07-28 ENCOUNTER — Emergency Department
Admission: EM | Admit: 2019-07-28 | Discharge: 2019-07-28 | Disposition: A | Discharge status: Home / Self Care | Payer: Self-pay | Attending: Emergency Medicine | Admitting: Emergency Medicine

## 2019-07-28 DIAGNOSIS — S62339A Displaced fracture of neck of unspecified metacarpal bone, initial encounter for closed fracture: Secondary | ICD-10-CM

## 2019-07-28 MED ORDER — OXYCODONE-ACETAMINOPHEN 5-325 MG PO TABS
1.0000 | ORAL_TABLET | Freq: Once | ORAL | Status: AC
  Administered 2019-07-28: 1 via ORAL
  Filled 2019-07-28: qty 1

## 2019-07-28 MED ORDER — OXYCODONE-ACETAMINOPHEN 5-325 MG PO TABS: 1.0000 | ORAL_TABLET | ORAL | 0 refills | Status: AC | PRN
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
## 2023-06-05 ENCOUNTER — Other Ambulatory Visit (HOSPITAL_COMMUNITY): Payer: Self-pay
